# Patient Record
Sex: Female | Born: 1935 | Race: White | Hispanic: No | Smoking: Former smoker
Health system: Southern US, Community
[De-identification: ages and names within clinical notes are randomized; demographics above are authoritative.]

## PROBLEM LIST (undated history)

## (undated) DIAGNOSIS — F0390 Unspecified dementia without behavioral disturbance: Secondary | ICD-10-CM

## (undated) DIAGNOSIS — I10 Essential (primary) hypertension: Secondary | ICD-10-CM

## (undated) DIAGNOSIS — B338 Other specified viral diseases: Secondary | ICD-10-CM

## (undated) DIAGNOSIS — M1990 Unspecified osteoarthritis, unspecified site: Secondary | ICD-10-CM

## (undated) DIAGNOSIS — I639 Cerebral infarction, unspecified: Secondary | ICD-10-CM

## (undated) HISTORY — PX: FOOT SURGERY: SHX648

## (undated) HISTORY — PX: REPLACEMENT TOTAL KNEE: SUR1224

## (undated) HISTORY — PX: HIP SURGERY: SHX245

## (undated) HISTORY — PX: ABDOMINAL HYSTERECTOMY: SHX81

## (undated) HISTORY — DX: Other specified viral diseases: B33.8

---

## 2020-04-21 ENCOUNTER — Emergency Department (HOSPITAL_COMMUNITY)
Admission: EM | Admit: 2020-04-21 | Discharge: 2020-04-21 | Disposition: A | Discharge status: Home / Self Care | Payer: Medicare Other | Attending: Emergency Medicine | Admitting: Emergency Medicine

## 2020-04-21 ENCOUNTER — Emergency Department (HOSPITAL_COMMUNITY): Payer: Medicare Other

## 2020-04-21 ENCOUNTER — Encounter (HOSPITAL_COMMUNITY): Payer: Self-pay | Admitting: Emergency Medicine

## 2020-04-21 ENCOUNTER — Other Ambulatory Visit: Payer: Self-pay

## 2020-04-21 DIAGNOSIS — R441 Visual hallucinations: Secondary | ICD-10-CM | POA: Insufficient documentation

## 2020-04-21 DIAGNOSIS — I10 Essential (primary) hypertension: Secondary | ICD-10-CM | POA: Insufficient documentation

## 2020-04-21 DIAGNOSIS — F0390 Unspecified dementia without behavioral disturbance: Secondary | ICD-10-CM | POA: Insufficient documentation

## 2020-04-21 DIAGNOSIS — R519 Headache, unspecified: Secondary | ICD-10-CM | POA: Diagnosis not present

## 2020-04-21 DIAGNOSIS — Z79899 Other long term (current) drug therapy: Secondary | ICD-10-CM | POA: Insufficient documentation

## 2020-04-21 HISTORY — DX: Unspecified osteoarthritis, unspecified site: M19.90

## 2020-04-21 HISTORY — DX: Essential (primary) hypertension: I10

## 2020-04-21 HISTORY — DX: Cerebral infarction, unspecified: I63.9

## 2020-04-21 HISTORY — DX: Unspecified dementia, unspecified severity, without behavioral disturbance, psychotic disturbance, mood disturbance, and anxiety: F03.90

## 2020-04-21 LAB — COMPREHENSIVE METABOLIC PANEL
ALT: 9 U/L (ref 0–44)
AST: 26 U/L (ref 15–41)
Albumin: 4.2 g/dL (ref 3.5–5.0)
Alkaline Phosphatase: 95 U/L (ref 38–126)
Anion gap: 11 (ref 5–15)
BUN: 28 mg/dL — ABNORMAL HIGH (ref 8–23)
CO2: 26 mmol/L (ref 22–32)
Calcium: 9.6 mg/dL (ref 8.9–10.3)
Chloride: 102 mmol/L (ref 98–111)
Creatinine, Ser: 1.25 mg/dL — ABNORMAL HIGH (ref 0.44–1.00)
GFR, Estimated: 41 mL/min — ABNORMAL LOW (ref >60)
Glucose, Bld: 146 mg/dL — ABNORMAL HIGH (ref 70–99)
Potassium: 4.5 mmol/L (ref 3.5–5.1)
Sodium: 139 mmol/L (ref 135–145)
Total Bilirubin: 0.7 mg/dL (ref 0.3–1.2)
Total Protein: 8 g/dL (ref 6.5–8.1)

## 2020-04-21 LAB — CBC WITH DIFFERENTIAL/PLATELET
Abs Immature Granulocytes: 0.04 10*3/uL (ref 0.00–0.07)
Basophils Absolute: 0.1 10*3/uL (ref 0.0–0.1)
Basophils Relative: 0 %
Eosinophils Absolute: 0.2 10*3/uL (ref 0.0–0.5)
Eosinophils Relative: 1 %
HCT: 41.8 % (ref 36.0–46.0)
Hemoglobin: 12.9 g/dL (ref 12.0–15.0)
Immature Granulocytes: 0 %
Lymphocytes Relative: 17 %
Lymphs Abs: 2.2 10*3/uL (ref 0.7–4.0)
MCH: 29.9 pg (ref 26.0–34.0)
MCHC: 30.9 g/dL (ref 30.0–36.0)
MCV: 96.8 fL (ref 80.0–100.0)
Monocytes Absolute: 1.3 10*3/uL — ABNORMAL HIGH (ref 0.1–1.0)
Monocytes Relative: 10 %
Neutro Abs: 9.1 10*3/uL — ABNORMAL HIGH (ref 1.7–7.7)
Neutrophils Relative %: 72 %
Platelets: 226 10*3/uL (ref 150–400)
RBC: 4.32 MIL/uL (ref 3.87–5.11)
RDW: 17.1 % — ABNORMAL HIGH (ref 11.5–15.5)
WBC: 12.9 10*3/uL — ABNORMAL HIGH (ref 4.0–10.5)
nRBC: 0 % (ref 0.0–0.2)

## 2020-04-21 LAB — URINALYSIS, ROUTINE W REFLEX MICROSCOPIC
Bacteria, UA: NONE SEEN
Bilirubin Urine: NEGATIVE
Glucose, UA: NEGATIVE mg/dL
Hgb urine dipstick: NEGATIVE
Ketones, ur: NEGATIVE mg/dL
Nitrite: NEGATIVE
Protein, ur: 30 mg/dL — AB
Specific Gravity, Urine: 1.017 (ref 1.005–1.030)
pH: 7 (ref 5.0–8.0)

## 2020-04-21 LAB — LIPASE, BLOOD: Lipase: 32 U/L (ref 11–51)

## 2020-04-21 MED ORDER — CEPHALEXIN 500 MG PO CAPS: 500.0000 mg | ORAL_CAPSULE | Freq: Two times a day (BID) | ORAL | 0 refills | Status: AC

## 2020-04-21 MED ORDER — SODIUM CHLORIDE 0.9 % IV SOLN
1.0000 g | Freq: Once | INTRAVENOUS | Status: AC
  Administered 2020-04-21: 1 g via INTRAVENOUS
  Filled 2020-04-21: qty 10

## 2020-04-21 NOTE — ED Provider Notes (Signed)
[facility] EMERGENCY DEPARTMENT Provider Note   CSN: [id number] Arrival date & time: 04/21/20  1642     History No chief complaint on file.   Susan Osborne is a 87 y.o. female with a history of hypertension, CVA and dementia presenting for evaluation of hallucinations and daily headache which has been present for about the past 2 weeks.  She describes headache pain which seems to be worse at night, improves during the daytime.  Daughter at the bedside states that she has been having visual hallucinations, for example while sitting here this evening she commented on the faces that she sees in the wall in front of her.  She has had chronic nausea without emesis and has seen her PCP for this problem, currently taking Zofran to help her with the symptom.  Daughter states she fell last week additionally, patient recalls getting wedged between the toilet and the wall but denies hitting her head.  She denies difficulty walking, dizziness, no focal weakness.  Additionally she has had no fevers or chills, no chest pain, shortness of breath, denies ear pain, tinnitus, neck pain or stiffness.  She reports a fair appetite.  She lives alone but has spent the past several days at her daughter's home.  The history is provided by the patient and a relative.       Past Medical History:  Diagnosis Date  . Arthritis   . Dementia (HCC)   . Hypertension   . Stroke [facility])     There are no problems to display for this patient.   Past Surgical History:  Procedure Laterality Date  . ABDOMINAL HYSTERECTOMY    . FOOT SURGERY Right   . HIP SURGERY Right   . REPLACEMENT TOTAL KNEE Right      OB History   No obstetric history on file.     No family history on file.     Home Medications Prior to Admission medications   Medication Sig Start Date End Date Taking? Authorizing Provider  amLODipine (NORVASC) 5 MG tablet Take 5 mg by mouth daily. 07/02/19  Yes [provider]  atorvastatin  (LIPITOR) 40 MG tablet Take 40 mg by mouth at bedtime. 04/03/19  Yes [provider]  busPIRone (BUSPAR) 10 MG tablet Take 10 mg by mouth 2 (two) times daily. 04/08/20  Yes [provider]  Cholecalciferol 50 MCG (2000 UT) TABS Take 1 tablet by mouth daily. 10/28/19 10/27/20 Yes [provider]  dipyridamole-aspirin (AGGRENOX) 200-25 MG 12hr capsule Take 1 capsule by mouth 2 (two) times daily. 03/04/19  Yes [provider]  donepezil (ARICEPT) 10 MG tablet Take 10 mg by mouth at bedtime. 09/30/19  Yes [provider]  famotidine (PEPCID) 40 MG tablet Take 40 mg by mouth daily. 03/25/20  Yes [provider]  levothyroxine (SYNTHROID) 25 MCG tablet Take 25 mcg by mouth daily before breakfast. 03/30/20  Yes [provider]  ondansetron (ZOFRAN) 4 MG tablet Take 4 mg by mouth every 8 (eight) hours as needed for nausea or vomiting. 03/25/20  Yes [provider]  oxyCODONE (OXY IR/ROXICODONE) 5 MG immediate release tablet Take 5 mg by mouth every 6 (six) hours as needed for moderate pain.   Yes [provider]  venlafaxine XR (EFFEXOR-XR) 150 MG 24 hr capsule Take by mouth daily with breakfast. 04/08/20  Yes [provider]  amoxicillin (AMOXIL) 875 MG tablet amoxicillin 875 mg tablet Patient not taking: No sig reported    [provider]  celecoxib (CELEBREX) 200 MG capsule Celebrex 200 mg capsule Patient not taking: No sig reported    [provider]  citalopram (CELEXA) 10 MG tablet citalopram 10 mg tablet    [provider]  clotrimazole-betamethasone (LOTRISONE) cream clotrimazole-betamethasone 1 %-0.05 % topical cream Patient not taking: No sig reported    [provider]  Cyanocobalamin 5000 MCG SUBL Place under the tongue. Patient not taking: No sig reported 02/06/20   [provider]  DULoxetine (CYMBALTA) 30 MG capsule Take 30 mg by mouth at bedtime. Patient not  taking: No sig reported 12/12/19   [provider]  enoxaparin (LOVENOX) 30 MG/0.3ML injection SMARTSIG:0.3 Milliliter(s) SUB-Q Daily Patient not taking: No sig reported 10/29/19   [provider]  fluconazole (DIFLUCAN) 100 MG tablet fluconazole 100 mg tablet Patient not taking: No sig reported    [provider]  furosemide (LASIX) 20 MG tablet furosemide 20 mg tablet  TK 1 T PO QAM Patient not taking: No sig reported    [provider]  lidocaine (XYLOCAINE) 5 % ointment lidocaine 5 % topical ointment Patient not taking: No sig reported    [provider]  mupirocin ointment (BACTROBAN) 2 % mupirocin 2 % topical ointment Patient not taking: No sig reported    [provider]  omeprazole (PRILOSEC) 40 MG capsule omeprazole 40 mg capsule,delayed release Patient not taking: No sig reported 04/03/19   [provider]  potassium chloride (KLOR-CON) 10 MEQ tablet Take 10 mEq by mouth daily. Patient not taking: No sig reported 12/30/19   [provider]  triamcinolone cream (KENALOG) 0.1 % triamcinolone acetonide 0.1 % topical cream Patient not taking: No sig reported    [provider]    Allergies    Patient has no known allergies.  Review of Systems   Review of Systems  Constitutional: Negative for chills and fever.  HENT: Negative for congestion.   Eyes: Negative.   Respiratory: Negative for chest tightness and shortness of breath.   Cardiovascular: Negative for chest pain.  Gastrointestinal: Positive for nausea. Negative for abdominal pain.  Genitourinary: Negative.   Musculoskeletal: Negative for arthralgias, joint swelling and neck pain.  Skin: Negative.  Negative for rash and wound.  Neurological: Positive for headaches. Negative for dizziness, weakness, light-headedness and numbness.  Psychiatric/Behavioral: Positive for confusion and hallucinations.    Physical Exam Updated Vital Signs BP  128/76   Pulse 93   Temp 98.7 F (37.1 C) (Oral)   Resp (!) 22   SpO2 95%   Physical Exam Vitals and nursing note reviewed.  Constitutional:      Appearance: She is well-developed.  HENT:     Head: Normocephalic and atraumatic.  Eyes:     Conjunctiva/sclera: Conjunctivae normal.  Cardiovascular:     Rate and Rhythm: Normal rate and regular rhythm.     Heart sounds: Normal heart sounds.  Pulmonary:     Effort: Pulmonary effort is normal.     Breath sounds: Normal breath sounds. No wheezing.  Abdominal:     General: Bowel sounds are normal.     Palpations: Abdomen is soft.     Tenderness: There is no abdominal tenderness.  Musculoskeletal:        General: Normal range of motion.     Cervical back: Normal range of motion.  Skin:    General: Skin is warm and dry.  Neurological:     General: No focal deficit present.     Mental Status: She  is alert.     Cranial Nerves: Cranial nerves are intact. No cranial nerve deficit, dysarthria or facial asymmetry.     Sensory: No sensory deficit.     Motor: Motor function is intact. No weakness.     Coordination: Rapid alternating movements normal.     Comments: Equal grip strength. Oriented to person and time.   Psychiatric:        Mood and Affect: Mood normal.     ED Results / Procedures / Treatments   Labs (all labs ordered are listed, but only abnormal results are displayed) Labs Reviewed  CBC WITH DIFFERENTIAL/PLATELET - Abnormal; Notable for the following components:      Result Value   WBC 12.9 (*)    RDW 17.1 (*)    Neutro Abs 9.1 (*)    Monocytes Absolute 1.3 (*)    All other components within normal limits  URINALYSIS, ROUTINE W REFLEX MICROSCOPIC - Abnormal; Notable for the following components:   APPearance HAZY (*)    Protein, ur 30 (*)    Leukocytes,Ua TRACE (*)    All other components within normal limits  COMPREHENSIVE METABOLIC PANEL - Abnormal; Notable for the following components:   Glucose, Bld 146 (*)     BUN 28 (*)    Creatinine, Ser 1.25 (*)    GFR, Estimated 41 (*)    All other components within normal limits  URINE CULTURE  LIPASE, BLOOD    EKG None  Radiology CT Head Wo Contrast  Result Date: 04/21/2020 CLINICAL DATA:  Mental status change, unknown cause EXAM: CT HEAD WITHOUT CONTRAST TECHNIQUE: Contiguous axial images were obtained from the base of the skull through the vertex without intravenous contrast. COMPARISON:  None. FINDINGS: Brain: Brain volume is normal for age. No intracranial hemorrhage, mass effect, or midline shift. No hydrocephalus. The basilar cisterns are patent. Mild to moderate periventricular and deep chronic small vessel ischemia. No evidence of territorial infarct or acute ischemia. No extra-axial or intracranial fluid collection. Vascular: Atherosclerosis of skullbase vasculature without hyperdense vessel or abnormal calcification. Skull: No fracture or focal lesion. Sinuses/Orbits: Paranasal sinuses and mastoid air cells are clear. The visualized orbits are unremarkable. Bilateral cataract resection. Other: None. IMPRESSION: 1. No acute intracranial abnormality. 2. Normal for age atrophy. Mild to moderate chronic small vessel ischemia. Electronically Signed   By: Narda Rutherford M.D.   On: 04/21/2020 17:56   DG Chest Port 1 View  Result Date: 04/21/2020 CLINICAL DATA:  Altered mental status EXAM: PORTABLE CHEST 1 VIEW COMPARISON:  None. FINDINGS: Patient rotated right. Midline trachea. Mild cardiomegaly. Atherosclerosis in the transverse aorta. Apparent right paratracheal soft tissue fullness could be due to technique and prominent great vessels. Mild right hemidiaphragm elevation. Suspicion of a small hiatal hernia. No pleural effusion or pneumothorax. No lobar consolidation. No congestive failure. Mild pulmonary interstitial prominence is nonspecific, especially in this age group. IMPRESSION: No acute process or explanation for altered mental status. Apparent  right paratracheal soft tissue fullness could be due to prominent great vessels and AP portable technique. Consider follow-up with PA and lateral radiographs with attention to this area. Possible hiatal hernia. Aortic Atherosclerosis (ICD10-I70.0). Electronically Signed   By: Jeronimo Greaves M.D.   On: 04/21/2020 18:19    Procedures Procedures   Medications Ordered in ED Medications  cefTRIAXone (ROCEPHIN) 1 g in sodium chloride 0.9 % 100 mL IVPB (1 g Intravenous New Bag/Given 04/21/20 2157)    ED Course  I have reviewed the triage vital  signs and the nursing notes.  Pertinent labs & imaging results that were available during my care of the patient were reviewed by me and considered in my medical decision making (see chart for details).    MDM Rules/Calculators/A&P                          Patient with increasing confusion of unclear etiology, associated with hallucinations.  Patient is aware of these hallucinations and recognized them as not to be normal.  She is appropriate during this ED visit, alert and oriented x2, baseline mentation per daughter at bedside and this 87-year-old with known dementia.  She does have leukocytes in her urine, no symptoms suggesting UTI but will treat for this possibility.  She was given an IV dose of Rocephin here.  We will send her home with Keflex and close follow-up with her PCP.    Patient was also seen by Dr. Renaye Rakersrifan during this ED visit who discussed alternative treatment, specifically admission, but would probably ultimately be for placement in a memory care unit.  Patient and daughter at the bedside are not ready for this step and agreed to home trial with antibiotics in the event this is a UTI which is causing her confusion.  We will plan close follow-up with her PCP in a couple of days if this is not improving.  She will go home to daughters home.  Final Clinical Impression(s) / ED Diagnoses Final diagnoses:  Hallucinations, visual    Rx / DC  Orders ED Discharge Orders    None       Victoriano Laindol, Katesha Eichel, PA-C 04/22/20 1640    Terald Sleeperrifan, Matthew J, MD 05/02/20 [phone]

## 2020-04-21 NOTE — ED Triage Notes (Signed)
Emergency Medicine Provider Triage Evaluation Note  Susan Osborne , a 88 y.o. female  was evaluated in triage.  Pt complains of daily headache, worse at night now with hallucinations, seeing faces on the wall, denies head injury but fell last week.  Nausea and vomiting intermittently, no fever, cp, sob, abd pain.  No dysuria.   Review of Systems  Positive: Headaches, AMS, nv Negative: Fever, cp, sob, abd pain, focal weakness  Physical Exam  BP 104/79 (BP Location: Right Arm)   Pulse (!) 101   Temp 98.7 F (37.1 C) (Oral)   Resp 14   SpO2 98%  Gen:   Awake, no distress  HEENT:  Atraumatic Resp:  Normal effort  Cardiac:  Normal rate borderline tachy Abd:   Nondistended, nontender . MSK:   Moves extremities without difficulty , no neglect Neuro:  Speech clear   Medical Decision Making  Medically screening exam initiated at 5:16 PM.  Appropriate orders placed.  Gera Sarno was informed that the remainder of the evaluation will be completed by another provider, this initial triage assessment does not replace that evaluation, and the importance of remaining in the ED until their evaluation is complete.  Clinical Impression  AMS, headache, n/v.   Burgess Amor, PA-C 04/21/20 1719

## 2020-04-21 NOTE — Discharge Instructions (Signed)
Your exam and labs tonight are reassuring as is your head CT.  There is some haziness in your urine along with white blood cells which may suggest an early urinary tract infection.  This can cause confusion.  You are being treated for this potential infection with the medication prescribed take your first tablet dose tomorrow morning once you get the prescription.  It will be very important for you to have close follow-up with your primary doctor, call for a recheck appointment within the next several days, especially if you have worsening symptoms of confusion or hallucinations, or you develop new symptoms such as fever, vomiting or abdominal pain.

## 2020-04-21 NOTE — ED Notes (Signed)
Pt attempted to provide urine sample and accidentally missed nuns cap. Will try to obtain urine sample again.

## 2020-04-21 NOTE — ED Triage Notes (Addendum)
having hallucinations, constipated that has continued to get worse over the past week.  Headache reported today. Nausea that has been going on for weeks, pt currently taking zofran for the nausea.  Family member states she fell last week but didn't hit her head.

## 2020-04-23 LAB — URINE CULTURE: Culture: <10000 — AB

## 2020-06-18 ENCOUNTER — Other Ambulatory Visit: Payer: Self-pay

## 2020-06-18 ENCOUNTER — Emergency Department (HOSPITAL_COMMUNITY): Payer: Medicare Other

## 2020-06-18 ENCOUNTER — Encounter (HOSPITAL_COMMUNITY): Payer: Self-pay | Admitting: *Deleted

## 2020-06-18 ENCOUNTER — Emergency Department (HOSPITAL_COMMUNITY)
Admission: EM | Admit: 2020-06-18 | Discharge: 2020-06-18 | Disposition: A | Discharge status: Home / Self Care | Payer: Medicare Other | Attending: Emergency Medicine | Admitting: Emergency Medicine

## 2020-06-18 DIAGNOSIS — Z79899 Other long term (current) drug therapy: Secondary | ICD-10-CM | POA: Diagnosis not present

## 2020-06-18 DIAGNOSIS — R112 Nausea with vomiting, unspecified: Secondary | ICD-10-CM

## 2020-06-18 DIAGNOSIS — Z7901 Long term (current) use of anticoagulants: Secondary | ICD-10-CM | POA: Diagnosis not present

## 2020-06-18 DIAGNOSIS — E860 Dehydration: Secondary | ICD-10-CM | POA: Diagnosis not present

## 2020-06-18 DIAGNOSIS — F0390 Unspecified dementia without behavioral disturbance: Secondary | ICD-10-CM | POA: Diagnosis not present

## 2020-06-18 DIAGNOSIS — R519 Headache, unspecified: Secondary | ICD-10-CM | POA: Diagnosis not present

## 2020-06-18 DIAGNOSIS — Z7982 Long term (current) use of aspirin: Secondary | ICD-10-CM | POA: Diagnosis not present

## 2020-06-18 DIAGNOSIS — I10 Essential (primary) hypertension: Secondary | ICD-10-CM | POA: Insufficient documentation

## 2020-06-18 LAB — CBC WITH DIFFERENTIAL/PLATELET
Abs Immature Granulocytes: 0.05 10*3/uL (ref 0.00–0.07)
Basophils Absolute: 0 10*3/uL (ref 0.0–0.1)
Basophils Relative: 0 %
Eosinophils Absolute: 0.1 10*3/uL (ref 0.0–0.5)
Eosinophils Relative: 1 %
HCT: 41.2 % (ref 36.0–46.0)
Hemoglobin: 13.2 g/dL (ref 12.0–15.0)
Immature Granulocytes: 0 %
Lymphocytes Relative: 14 %
Lymphs Abs: 1.8 10*3/uL (ref 0.7–4.0)
MCH: 31.1 pg (ref 26.0–34.0)
MCHC: 32 g/dL (ref 30.0–36.0)
MCV: 96.9 fL (ref 80.0–100.0)
Monocytes Absolute: 1.2 10*3/uL — ABNORMAL HIGH (ref 0.1–1.0)
Monocytes Relative: 9 %
Neutro Abs: 9.1 10*3/uL — ABNORMAL HIGH (ref 1.7–7.7)
Neutrophils Relative %: 76 %
Platelets: 224 10*3/uL (ref 150–400)
RBC: 4.25 MIL/uL (ref 3.87–5.11)
RDW: 16.2 % — ABNORMAL HIGH (ref 11.5–15.5)
WBC: 12.2 10*3/uL — ABNORMAL HIGH (ref 4.0–10.5)
nRBC: 0 % (ref 0.0–0.2)

## 2020-06-18 LAB — COMPREHENSIVE METABOLIC PANEL
ALT: 10 U/L (ref 0–44)
AST: 14 U/L — ABNORMAL LOW (ref 15–41)
Albumin: 4 g/dL (ref 3.5–5.0)
Alkaline Phosphatase: 90 U/L (ref 38–126)
Anion gap: 8 (ref 5–15)
BUN: 16 mg/dL (ref 8–23)
CO2: 24 mmol/L (ref 22–32)
Calcium: 9.4 mg/dL (ref 8.9–10.3)
Chloride: 105 mmol/L (ref 98–111)
Creatinine, Ser: 0.88 mg/dL (ref 0.44–1.00)
GFR, Estimated: >60 mL/min (ref >60)
Glucose, Bld: 152 mg/dL — ABNORMAL HIGH (ref 70–99)
Potassium: 3.5 mmol/L (ref 3.5–5.1)
Sodium: 137 mmol/L (ref 135–145)
Total Bilirubin: 0.6 mg/dL (ref 0.3–1.2)
Total Protein: 7.5 g/dL (ref 6.5–8.1)

## 2020-06-18 LAB — URINALYSIS, ROUTINE W REFLEX MICROSCOPIC
Bilirubin Urine: NEGATIVE
Glucose, UA: NEGATIVE mg/dL
Ketones, ur: NEGATIVE mg/dL
Leukocytes,Ua: NEGATIVE
Nitrite: NEGATIVE
Protein, ur: 100 mg/dL — AB
Specific Gravity, Urine: 1.018 (ref 1.005–1.030)
pH: 5 (ref 5.0–8.0)

## 2020-06-18 MED ORDER — SODIUM CHLORIDE 0.9 % IV SOLN: INTRAVENOUS | Status: DC

## 2020-06-18 MED ORDER — SCOPOLAMINE 1 MG/3DAYS TD PT72: 1.0000 | MEDICATED_PATCH | TRANSDERMAL | 12 refills | Status: DC

## 2020-06-18 MED ORDER — ONDANSETRON HCL 4 MG/2ML IJ SOLN
4.0000 mg | INTRAMUSCULAR | Status: AC
  Administered 2020-06-18: 4 mg via INTRAVENOUS
  Filled 2020-06-18: qty 2

## 2020-06-18 NOTE — ED Triage Notes (Signed)
Intermittent vomiting for several months

## 2020-06-18 NOTE — Discharge Instructions (Addendum)
Your symptoms showed no Urinary tract infection (the culture has been sent to confirm and will result in the next 48 hours).  If there is an infection we will call in an antibiotic for you.  If it is not infected, you will not receive a phone call. Your blood work was normal Your CT scan of the head was normal The Chest Xray was normal  We have given you some IV fluids  As an alternative to taking Zofran, I would recommend trying the scopolamine patch which I have prescribed.  You placed this behind the ear, you may switch it every 72 hours with a new patch but make sure you take the old one off.  Also be aware that this may make you sleepy, please do not drive a vehicle and make sure that someone is watching in the first 24 hours to make sure you do not have any side effects.  Please follow-up with your family doctor within 48 hours and have them obtain your results so that you can discuss the neck steps which may include an endoscopy by gastroenterologist.

## 2020-06-18 NOTE — ED Provider Notes (Signed)
[facility] Provider Note   CSN: [id number] Arrival date & time: 06/18/20  1454     History Chief Complaint  Patient presents with   Emesis    [facility] is a 87 y.o. female.  This patient has dementia - level 5 caveat applies Daughter is historian - has had 2-3 months of vomiting - persistent but worsening - taking zofran daily but no help Vomits at all times - not just when eating - no associated diarrhea, fever or coughing Seen by PCP - CT ordered at [facility]  ED - neg per family Hasn't had endoscopy yet - has had some weight loss Prior hx of BRCA s/p resection many years ago On chronic opiates for her chronic leg pain - for years has been on vicodin. No new sx - PCP told her to bring pt to ED to day as they weren't able to see her.   Emesis     Past Medical History:  Diagnosis Date   Arthritis    Dementia ([place])    Hypertension    Stroke ([place])     There are no problems to display for this patient.   Past Surgical History:  Procedure Laterality Date   ABDOMINAL HYSTERECTOMY     FOOT SURGERY Right    HIP SURGERY Right    REPLACEMENT TOTAL KNEE Right      OB History   No obstetric history on file.     No family history on file.     Home Medications Prior to Admission medications   Medication Sig Start Date End Date Taking? Authorizing Provider  scopolamine (TRANSDERM-SCOP, 1.5 MG,) 1 MG/3DAYS Place 1 patch (1.5 mg total) onto the skin every 3 (three) days. 06/18/20  Yes Noemi Chapel, MD  amLODipine (NORVASC) 5 MG tablet Take 5 mg by mouth daily. 07/02/19   [provider]  amoxicillin (AMOXIL) 875 MG tablet amoxicillin 875 mg tablet Patient not taking: No sig reported    [provider]  atorvastatin (LIPITOR) 40 MG tablet Take 40 mg by mouth at bedtime. 04/03/19   [provider]  busPIRone (BUSPAR) 10 MG tablet Take 10 mg by mouth 2 (two) times daily. 04/08/20   [provider]  celecoxib  (CELEBREX) 200 MG capsule Celebrex 200 mg capsule Patient not taking: No sig reported    [provider]  Cholecalciferol 50 MCG (2000 UT) TABS Take 1 tablet by mouth daily. 10/28/19 10/27/20  [provider]  citalopram (CELEXA) 10 MG tablet citalopram 10 mg tablet    [provider]  clotrimazole-betamethasone (LOTRISONE) cream clotrimazole-betamethasone 1 %-0.05 % topical cream Patient not taking: No sig reported    [provider]  Cyanocobalamin 5000 [place] under the tongue. Patient not taking: No sig reported 02/06/20   [provider]  dipyridamole-aspirin (AGGRENOX) 200-25 MG 12hr capsule Take 1 capsule by mouth 2 (two) times daily. 03/04/19   [provider]  donepezil (ARICEPT) 10 MG tablet Take 10 mg by mouth at bedtime. 09/30/19   [provider]  DULoxetine (CYMBALTA) 30 MG capsule Take 30 mg by mouth at bedtime. Patient not taking: No sig reported 12/12/19   [provider]  enoxaparin (LOVENOX) 30 MG/0.3ML injection SMARTSIG:0.3 Milliliter(s) SUB-Q Daily Patient not taking: No sig reported 10/29/19   [provider]  famotidine (PEPCID) 40 MG tablet Take 40 mg by mouth daily. 03/25/20   [provider]  fluconazole (DIFLUCAN) 100 MG tablet fluconazole 100 mg tablet  Patient not taking: No sig reported    [provider]  furosemide (LASIX) 20 MG tablet furosemide 20 mg tablet  TK 1 T PO QAM Patient not taking: No sig reported    [provider]  levothyroxine (SYNTHROID) 25 MCG tablet Take 25 mcg by mouth daily before breakfast. 03/30/20   [provider]  lidocaine (XYLOCAINE) 5 % ointment lidocaine 5 % topical ointment Patient not taking: No sig reported    [provider]  mupirocin ointment (BACTROBAN) 2 % mupirocin 2 % topical ointment Patient not taking: No sig reported    [provider]  omeprazole (PRILOSEC) 40 MG capsule omeprazole  40 mg capsule,delayed release Patient not taking: No sig reported 04/03/19   [provider]  ondansetron (ZOFRAN) 4 MG tablet Take 4 mg by mouth every 8 (eight) hours as needed for nausea or vomiting. 03/25/20   [provider]  oxyCODONE (OXY IR/ROXICODONE) 5 MG immediate release tablet Take 5 mg by mouth every 6 (six) hours as needed for moderate pain.    [provider]  potassium chloride (KLOR-CON) 10 MEQ tablet Take 10 mEq by mouth daily. Patient not taking: No sig reported 12/30/19   [provider]  triamcinolone cream (KENALOG) 0.1 % triamcinolone acetonide 0.1 % topical cream Patient not taking: No sig reported    [provider]  venlafaxine XR (EFFEXOR-XR) 150 MG 24 hr capsule Take by mouth daily with breakfast. 04/08/20   [provider]    Allergies    Patient has no known allergies.  Review of Systems   Review of Systems  Unable to perform ROS: Dementia  Gastrointestinal:  Positive for vomiting.   Physical Exam Updated Vital Signs BP (!) 150/73   Pulse 82   Temp 98.4 F (36.9 C)   Resp (!) 23   Ht 1.588 m (5' 2.5")   Wt 79.8 kg   SpO2 96%   BMI 31.68 kg/m   Physical Exam Vitals and nursing note reviewed.  Constitutional:      General: She is not in acute distress.    Appearance: She is well-developed.  HENT:     Head: Normocephalic and atraumatic.     Mouth/Throat:     Pharynx: No oropharyngeal exudate.  Eyes:     General: No scleral icterus.       Right eye: No discharge.        Left eye: No discharge.     Conjunctiva/sclera: Conjunctivae normal.     Pupils: Pupils are equal, round, and reactive to light.  Neck:     Thyroid: No thyromegaly.     Vascular: No JVD.  Cardiovascular:     Rate and Rhythm: Normal rate and regular rhythm.     Heart sounds: Normal heart sounds. No murmur heard.   No friction rub. No gallop.  Pulmonary:     Effort: Pulmonary effort is normal. No respiratory distress.      Breath sounds: Normal breath sounds. No wheezing or rales.  Abdominal:     General: Bowel sounds are normal. There is no distension.     Palpations: Abdomen is soft. There is no mass.     Tenderness: There is no abdominal tenderness.     Comments: No ttp over the abdomen  Musculoskeletal:        General: No tenderness. Normal range of motion.     Cervical back: Normal range of motion and neck supple.     Right lower leg:  No edema.     Left lower leg: No edema.  Lymphadenopathy:     Cervical: No cervical adenopathy.  Skin:    General: Skin is warm and dry.     Findings: No erythema or rash.  Neurological:     General: No focal deficit present.     Mental Status: She is alert. Mental status is at baseline.     Coordination: Coordination normal.  Psychiatric:        Behavior: Behavior normal.    ED Results / Procedures / Treatments   Labs (all labs ordered are listed, but only abnormal results are displayed) Labs Reviewed  CBC WITH DIFFERENTIAL/PLATELET - Abnormal; Notable for the following components:      Result Value   WBC 12.2 (*)    RDW 16.2 (*)    Neutro Abs 9.1 (*)    Monocytes Absolute 1.2 (*)    All other components within normal limits  COMPREHENSIVE METABOLIC PANEL - Abnormal; Notable for the following components:   Glucose, Bld 152 (*)    AST 14 (*)    All other components within normal limits  URINALYSIS, ROUTINE W REFLEX MICROSCOPIC - Abnormal; Notable for the following components:   APPearance HAZY (*)    Hgb urine dipstick SMALL (*)    Protein, ur 100 (*)    Bacteria, UA RARE (*)    All other components within normal limits  URINE CULTURE    EKG EKG Interpretation  Date/Time:  Thursday June 18 2020 15:15:34 EDT Ventricular Rate:  89 PR Interval:  151 QRS Duration: 100 QT Interval:  374 QTC Calculation: 456 R Axis:   -25 Text Interpretation: Sinus rhythm Probable left ventricular hypertrophy Anterior Q waves, possibly due to LVH No old tracing to  compare Confirmed by Noemi Chapel [phone]) on 06/18/2020 3:55:34 PM  Radiology DG Chest 2 View  Result Date: 06/18/2020 CLINICAL DATA:  Vomiting, weight loss. EXAM: CHEST - 2 VIEW COMPARISON:  April 21, 2020. FINDINGS: The heart size and mediastinal contours are within normal limits. Both lungs are clear. The visualized skeletal structures are unremarkable. IMPRESSION: No active cardiopulmonary disease. Aortic Atherosclerosis (ICD10-I70.0). Electronically Signed   By: Marijo Conception M.D.   On: 06/18/2020 16:25   CT Head Wo Contrast  Result Date: 06/18/2020 CLINICAL DATA:  Headache and vomiting. EXAM: CT HEAD WITHOUT CONTRAST TECHNIQUE: Contiguous axial images were obtained from the base of the skull through the vertex without intravenous contrast. COMPARISON:  April 21, 2020 FINDINGS: Brain: There is mild cerebral atrophy with widening of the extra-axial spaces and ventricular dilatation. There are areas of decreased attenuation within the white matter tracts of the supratentorial brain, consistent with microvascular disease changes. Vascular: No hyperdense vessel or unexpected calcification. Skull: Normal. Negative for fracture or focal lesion. Sinuses/Orbits: No acute finding. Other: None. IMPRESSION: 1. Generalized cerebral atrophy. 2. No acute intracranial abnormality. Electronically Signed   By: Virgina Norfolk M.D.   On: 06/18/2020 16:28    Procedures Procedures   Medications Ordered in ED Medications  0.9 %  sodium chloride infusion ( Intravenous New Bag/Given 06/18/20 1541)  ondansetron (ZOFRAN) injection 4 mg (4 mg Intravenous Given 06/18/20 1545)    ED Course  I have reviewed the triage vital signs and the nursing notes.  Pertinent labs & imaging results that were available during my care of the patient were reviewed by me and considered in my medical decision making (see chart for details).    MDM Rules/Calculators/A&P  No distress, persistent vomiting - VS  reassuring - needs labs, CT head due to the headaches and vomiting, CXR, labs and EKG, IVf, pt and family agreeable.    Labs reassuring - no obvious UTI, labs reassuring, fluids given, no signs of sepsis, CT head negative  Try scop patch at home - daughter given precautions Urine Culture sent  Final Clinical Impression(s) / ED Diagnoses Final diagnoses:  Non-intractable vomiting with nausea, unspecified vomiting type  Dehydration    Rx / DC Orders ED Discharge Orders          Ordered    scopolamine (TRANSDERM-SCOP, 1.5 MG,) 1 MG/3DAYS  every 72 hours        06/18/20 1732             Noemi Chapel, MD 06/18/20 1736

## 2020-06-20 LAB — URINE CULTURE: Culture: NO GROWTH

## 2020-07-21 ENCOUNTER — Other Ambulatory Visit: Payer: Self-pay

## 2020-07-21 ENCOUNTER — Emergency Department (HOSPITAL_COMMUNITY): Payer: Medicare Other

## 2020-07-21 ENCOUNTER — Encounter (HOSPITAL_COMMUNITY): Payer: Self-pay | Admitting: Emergency Medicine

## 2020-07-21 ENCOUNTER — Inpatient Hospital Stay (HOSPITAL_COMMUNITY)
Admission: EM | Admit: 2020-07-21 | Discharge: 2020-07-24 | DRG: 884 | Disposition: A | Discharge status: Home / Self Care | Payer: Medicare Other | Attending: Internal Medicine | Admitting: Internal Medicine

## 2020-07-21 DIAGNOSIS — I10 Essential (primary) hypertension: Secondary | ICD-10-CM | POA: Diagnosis present

## 2020-07-21 DIAGNOSIS — E041 Nontoxic single thyroid nodule: Secondary | ICD-10-CM

## 2020-07-21 DIAGNOSIS — E785 Hyperlipidemia, unspecified: Secondary | ICD-10-CM | POA: Diagnosis not present

## 2020-07-21 DIAGNOSIS — Z8673 Personal history of transient ischemic attack (TIA), and cerebral infarction without residual deficits: Secondary | ICD-10-CM | POA: Diagnosis not present

## 2020-07-21 DIAGNOSIS — R4182 Altered mental status, unspecified: Secondary | ICD-10-CM | POA: Diagnosis present

## 2020-07-21 DIAGNOSIS — Z79890 Hormone replacement therapy (postmenopausal): Secondary | ICD-10-CM

## 2020-07-21 DIAGNOSIS — G9341 Metabolic encephalopathy: Secondary | ICD-10-CM | POA: Diagnosis not present

## 2020-07-21 DIAGNOSIS — Z87891 Personal history of nicotine dependence: Secondary | ICD-10-CM | POA: Diagnosis not present

## 2020-07-21 DIAGNOSIS — E039 Hypothyroidism, unspecified: Secondary | ICD-10-CM | POA: Diagnosis present

## 2020-07-21 DIAGNOSIS — Z79899 Other long term (current) drug therapy: Secondary | ICD-10-CM | POA: Diagnosis not present

## 2020-07-21 DIAGNOSIS — Z96651 Presence of right artificial knee joint: Secondary | ICD-10-CM | POA: Diagnosis not present

## 2020-07-21 DIAGNOSIS — F0390 Unspecified dementia without behavioral disturbance: Principal | ICD-10-CM | POA: Diagnosis present

## 2020-07-21 DIAGNOSIS — D72829 Elevated white blood cell count, unspecified: Secondary | ICD-10-CM | POA: Diagnosis present

## 2020-07-21 DIAGNOSIS — I159 Secondary hypertension, unspecified: Secondary | ICD-10-CM | POA: Diagnosis not present

## 2020-07-21 DIAGNOSIS — M1990 Unspecified osteoarthritis, unspecified site: Secondary | ICD-10-CM | POA: Diagnosis not present

## 2020-07-21 DIAGNOSIS — Z885 Allergy status to narcotic agent status: Secondary | ICD-10-CM

## 2020-07-21 DIAGNOSIS — E669 Obesity, unspecified: Secondary | ICD-10-CM | POA: Diagnosis present

## 2020-07-21 DIAGNOSIS — Z20822 Contact with and (suspected) exposure to covid-19: Secondary | ICD-10-CM | POA: Diagnosis not present

## 2020-07-21 DIAGNOSIS — Z6831 Body mass index (BMI) 31.0-31.9, adult: Secondary | ICD-10-CM

## 2020-07-21 LAB — CBC WITH DIFFERENTIAL/PLATELET
Abs Immature Granulocytes: 0.12 10*3/uL — ABNORMAL HIGH (ref 0.00–0.07)
Basophils Absolute: 0.1 10*3/uL (ref 0.0–0.1)
Basophils Relative: 1 %
Eosinophils Absolute: 0.2 10*3/uL (ref 0.0–0.5)
Eosinophils Relative: 1 %
HCT: 41.3 % (ref 36.0–46.0)
Hemoglobin: 13.1 g/dL (ref 12.0–15.0)
Immature Granulocytes: 1 %
Lymphocytes Relative: 25 %
Lymphs Abs: 3.6 10*3/uL (ref 0.7–4.0)
MCH: 30.8 pg (ref 26.0–34.0)
MCHC: 31.7 g/dL (ref 30.0–36.0)
MCV: 97.2 fL (ref 80.0–100.0)
Monocytes Absolute: 1.6 10*3/uL — ABNORMAL HIGH (ref 0.1–1.0)
Monocytes Relative: 11 %
Neutro Abs: 8.9 10*3/uL — ABNORMAL HIGH (ref 1.7–7.7)
Neutrophils Relative %: 61 %
Platelets: 241 10*3/uL (ref 150–400)
RBC: 4.25 MIL/uL (ref 3.87–5.11)
RDW: 16.2 % — ABNORMAL HIGH (ref 11.5–15.5)
WBC: 14.5 10*3/uL — ABNORMAL HIGH (ref 4.0–10.5)
nRBC: 0 % (ref 0.0–0.2)

## 2020-07-21 LAB — COMPREHENSIVE METABOLIC PANEL
ALT: 13 U/L (ref 0–44)
AST: 16 U/L (ref 15–41)
Albumin: 4.1 g/dL (ref 3.5–5.0)
Alkaline Phosphatase: 119 U/L (ref 38–126)
Anion gap: 8 (ref 5–15)
BUN: 23 mg/dL (ref 8–23)
CO2: 25 mmol/L (ref 22–32)
Calcium: 9.2 mg/dL (ref 8.9–10.3)
Chloride: 103 mmol/L (ref 98–111)
Creatinine, Ser: 0.95 mg/dL (ref 0.44–1.00)
GFR, Estimated: 57 mL/min — ABNORMAL LOW (ref >60)
Glucose, Bld: 151 mg/dL — ABNORMAL HIGH (ref 70–99)
Potassium: 3.4 mmol/L — ABNORMAL LOW (ref 3.5–5.1)
Sodium: 136 mmol/L (ref 135–145)
Total Bilirubin: 0.4 mg/dL (ref 0.3–1.2)
Total Protein: 7.7 g/dL (ref 6.5–8.1)

## 2020-07-21 LAB — URINALYSIS, ROUTINE W REFLEX MICROSCOPIC
Bacteria, UA: NONE SEEN
Bilirubin Urine: NEGATIVE
Glucose, UA: NEGATIVE mg/dL
Ketones, ur: NEGATIVE mg/dL
Leukocytes,Ua: NEGATIVE
Nitrite: NEGATIVE
Protein, ur: NEGATIVE mg/dL
Specific Gravity, Urine: 1.004 — ABNORMAL LOW (ref 1.005–1.030)
pH: 5 (ref 5.0–8.0)

## 2020-07-21 LAB — TROPONIN I (HIGH SENSITIVITY)
Troponin I (High Sensitivity): 14 ng/L (ref <18)
Troponin I (High Sensitivity): 15 ng/L (ref <18)

## 2020-07-21 LAB — RESP PANEL BY RT-PCR (FLU A&B, COVID) ARPGX2
Influenza A by PCR: NEGATIVE
Influenza B by PCR: NEGATIVE
SARS Coronavirus 2 by RT PCR: NEGATIVE

## 2020-07-21 LAB — MAGNESIUM: Magnesium: 1.8 mg/dL (ref 1.7–2.4)

## 2020-07-21 LAB — LIPASE, BLOOD: Lipase: 45 U/L (ref 11–51)

## 2020-07-21 MED ORDER — ACETAMINOPHEN 325 MG PO TABS
650.0000 mg | ORAL_TABLET | Freq: Once | ORAL | Status: AC
  Administered 2020-07-21: 650 mg via ORAL
  Filled 2020-07-21: qty 2

## 2020-07-21 MED ORDER — SODIUM CHLORIDE 0.9 % IV BOLUS
500.0000 mL | Freq: Once | INTRAVENOUS | Status: AC
Start: 2020-07-21 — End: 2020-07-21
  Administered 2020-07-21: 500 mL via INTRAVENOUS

## 2020-07-21 MED ORDER — LEVOTHYROXINE SODIUM 25 MCG PO TABS
25.0000 ug | ORAL_TABLET | Freq: Every day | ORAL | Status: DC
  Administered 2020-07-22 – 2020-07-24 (×3): 25 ug via ORAL
  Filled 2020-07-21 (×3): qty 1

## 2020-07-21 MED ORDER — FAMOTIDINE 20 MG PO TABS
40.0000 mg | ORAL_TABLET | Freq: Every day | ORAL | Status: DC
  Administered 2020-07-22 – 2020-07-24 (×3): 40 mg via ORAL
  Filled 2020-07-21 (×3): qty 2

## 2020-07-21 MED ORDER — BUSPIRONE HCL 10 MG PO TABS
10.0000 mg | ORAL_TABLET | Freq: Two times a day (BID) | ORAL | Status: DC
  Administered 2020-07-22 – 2020-07-24 (×5): 10 mg via ORAL
  Filled 2020-07-21 (×5): qty 1

## 2020-07-21 MED ORDER — VENLAFAXINE HCL ER 75 MG PO CP24
150.0000 mg | ORAL_CAPSULE | Freq: Every day | ORAL | Status: DC
  Administered 2020-07-22 – 2020-07-24 (×3): 150 mg via ORAL
  Filled 2020-07-21 (×3): qty 2

## 2020-07-21 MED ORDER — ENOXAPARIN SODIUM 40 MG/0.4ML IJ SOSY
40.0000 mg | PREFILLED_SYRINGE | Freq: Every day | INTRAMUSCULAR | Status: DC
  Administered 2020-07-22 (×2): 40 mg via SUBCUTANEOUS
  Filled 2020-07-21 (×2): qty 0.4

## 2020-07-21 MED ORDER — IOHEXOL 350 MG/ML SOLN
100.0000 mL | Freq: Once | INTRAVENOUS | Status: AC | PRN
  Administered 2020-07-21: 100 mL via INTRAVENOUS

## 2020-07-21 MED ORDER — ACETAMINOPHEN 650 MG RE SUPP: 650.0000 mg | Freq: Four times a day (QID) | RECTAL | Status: DC | PRN

## 2020-07-21 MED ORDER — ATORVASTATIN CALCIUM 40 MG PO TABS
40.0000 mg | ORAL_TABLET | Freq: Every day | ORAL | Status: DC
  Administered 2020-07-22 – 2020-07-23 (×3): 40 mg via ORAL
  Filled 2020-07-21 (×3): qty 1

## 2020-07-21 MED ORDER — ONDANSETRON HCL 4 MG/2ML IJ SOLN
4.0000 mg | Freq: Once | INTRAMUSCULAR | Status: AC
  Administered 2020-07-21: 4 mg via INTRAVENOUS
  Filled 2020-07-21: qty 2

## 2020-07-21 MED ORDER — ONDANSETRON HCL 4 MG/2ML IJ SOLN: 4.0000 mg | Freq: Four times a day (QID) | INTRAMUSCULAR | Status: DC | PRN

## 2020-07-21 MED ORDER — DONEPEZIL HCL 10 MG PO TABS
10.0000 mg | ORAL_TABLET | Freq: Every day | ORAL | Status: DC
  Administered 2020-07-22 – 2020-07-23 (×3): 10 mg via ORAL
  Filled 2020-07-21 (×3): qty 1

## 2020-07-21 MED ORDER — ONDANSETRON HCL 4 MG PO TABS: 4.0000 mg | ORAL_TABLET | Freq: Four times a day (QID) | ORAL | Status: DC | PRN

## 2020-07-21 MED ORDER — HYDROCODONE-ACETAMINOPHEN 5-325 MG PO TABS: 1.0000 | ORAL_TABLET | Freq: Four times a day (QID) | ORAL | Status: DC | PRN

## 2020-07-21 MED ORDER — AMLODIPINE BESYLATE 5 MG PO TABS
5.0000 mg | ORAL_TABLET | Freq: Every day | ORAL | Status: DC
  Administered 2020-07-22 – 2020-07-24 (×3): 5 mg via ORAL
  Filled 2020-07-21 (×3): qty 1

## 2020-07-21 MED ORDER — ACETAMINOPHEN 325 MG PO TABS
650.0000 mg | ORAL_TABLET | Freq: Four times a day (QID) | ORAL | Status: DC | PRN
  Administered 2020-07-22: 650 mg via ORAL
  Filled 2020-07-21: qty 2

## 2020-07-21 NOTE — ED Notes (Signed)
Ambulated to bathroom with steady gait, continues to report pain right lower thigh with ambulation

## 2020-07-21 NOTE — ED Notes (Signed)
Attempted to call report x 1  

## 2020-07-21 NOTE — ED Provider Notes (Signed)
[facility] EMERGENCY DEPARTMENT Provider Note   CSN: [id number] Arrival date & time: 07/21/20  1231     History Chief Complaint  Patient presents with   Altered Mental Status    Susan Osborne is a 87 y.o. female.  HPI 87 year old female presents with concern for acute stroke/TIA.  History is from the patient and the daughter at the bedside and over the phone.  For the past few weeks and maybe up to a month she has had progressive intermittent symptoms that are concerning for stroke.  She will seem to be confused and have trouble speaking.  Her eyes will become dilated.  At max lasts about 10 minutes.  Today she had an episode where she stopped walking and the patient tells me that her whole body felt numb and she could not move.  Each time afterwards she gets a headache.  Today she is also having some vomiting.  She has a little bit of upper abdominal discomfort as well.  Right now the patient is at her mental status baseline, she does have some mild dementia.  There is report in the chart of possible facial droop but the daughter has not seen that.  Reportedly went to an outside hospital where she was told she might be having TIAs and they wanted to admit her but did not have room in the hospital so she was discharged.  Past Medical History:  Diagnosis Date   Arthritis    Dementia ([facility])    Hypertension    Stroke ([facility])     There are no problems to display for this patient.   Past Surgical History:  Procedure Laterality Date   ABDOMINAL HYSTERECTOMY     FOOT SURGERY Right    HIP SURGERY Right    REPLACEMENT TOTAL KNEE Right      OB History   No obstetric history on file.     History reviewed. No pertinent family history.     Home Medications Prior to Admission medications   Medication Sig Start Date End Date Taking? Authorizing Provider  amLODipine (NORVASC) 5 MG tablet Take 5 mg by mouth daily. 07/02/19   [provider]  amoxicillin (AMOXIL) 875 MG tablet  amoxicillin 875 mg tablet Patient not taking: No sig reported    [provider]  atorvastatin (LIPITOR) 40 MG tablet Take 40 mg by mouth at bedtime. 04/03/19   [provider]  busPIRone (BUSPAR) 10 MG tablet Take 10 mg by mouth 2 (two) times daily. 04/08/20   [provider]  celecoxib (CELEBREX) 200 MG capsule Celebrex 200 mg capsule Patient not taking: No sig reported    [provider]  Cholecalciferol 50 MCG (2000 UT) TABS Take 1 tablet by mouth daily. 10/28/19 10/27/20  [provider]  citalopram (CELEXA) 10 MG tablet citalopram 10 mg tablet    [provider]  clotrimazole-betamethasone (LOTRISONE) cream clotrimazole-betamethasone 1 %-0.05 % topical cream Patient not taking: No sig reported    [provider]  Cyanocobalamin 5000 MCG SUBL Place under the tongue. Patient not taking: No sig reported 02/06/20   [provider]  dipyridamole-aspirin (AGGRENOX) 200-25 MG 12hr capsule Take 1 capsule by mouth 2 (two) times daily. 03/04/19   [provider]  donepezil (ARICEPT) 10 MG tablet Take 10 mg by mouth at bedtime. 09/30/19   [provider]  DULoxetine (CYMBALTA) 30 MG capsule Take 30 mg by mouth at bedtime. Patient not taking: No sig reported 12/12/19  [provider]  enoxaparin (LOVENOX) 30 MG/0.3ML injection SMARTSIG:0.3 Milliliter(s) SUB-Q Daily Patient not taking: No sig reported 10/29/19   [provider]  famotidine (PEPCID) 40 MG tablet Take 40 mg by mouth daily. 03/25/20   [provider]  fluconazole (DIFLUCAN) 100 MG tablet fluconazole 100 mg tablet Patient not taking: No sig reported    [provider]  furosemide (LASIX) 20 MG tablet furosemide 20 mg tablet  TK 1 T PO QAM Patient not taking: No sig reported    [provider]  levothyroxine (SYNTHROID) 25 MCG tablet Take 25 mcg by mouth daily before breakfast. 03/30/20   [provider]  lidocaine (XYLOCAINE) 5 % ointment lidocaine 5 % topical ointment Patient not taking: No sig reported    [provider]  mupirocin ointment (BACTROBAN) 2 % mupirocin 2 % topical ointment Patient not taking: No sig reported    [provider]  omeprazole (PRILOSEC) 40 MG capsule omeprazole 40 mg capsule,delayed release Patient not taking: No sig reported 04/03/19   [provider]  ondansetron (ZOFRAN) 4 MG tablet Take 4 mg by mouth every 8 (eight) hours as needed for nausea or vomiting. 03/25/20   [provider]  oxyCODONE (OXY IR/ROXICODONE) 5 MG immediate release tablet Take 5 mg by mouth every 6 (six) hours as needed for moderate pain.    [provider]  potassium chloride (KLOR-CON) 10 MEQ tablet Take 10 mEq by mouth daily. Patient not taking: No sig reported 12/30/19   [provider]  scopolamine (TRANSDERM-SCOP, 1.5 MG,) 1 MG/3DAYS Place 1 patch (1.5 mg total) onto the skin every 3 (three) days. 06/18/20   Eber Hong, MD  triamcinolone cream (KENALOG) 0.1 % triamcinolone acetonide 0.1 % topical cream Patient not taking: No sig reported    [provider]  venlafaxine XR (EFFEXOR-XR) 150 MG 24 hr capsule Take by mouth daily with breakfast. 04/08/20   [provider]    Allergies    Codeine  Review of Systems   Review of Systems  Unable to perform ROS: Dementia   Physical Exam Updated Vital Signs BP (!) 141/126 (BP Location: Right Arm)   Pulse (!) 103   Temp 98.8 F (37.1 C) (Oral)   Resp 18   Ht 5\' 3"  (1.6 m)   Wt 81.6 kg   SpO2 99%   BMI 31.89 kg/m   Physical Exam Vitals and nursing note reviewed.  Constitutional:      General: She is not in acute distress.    Appearance: She is well-developed. She is not ill-appearing or diaphoretic.  HENT:     Head: Normocephalic and atraumatic.     Right Ear: External ear normal.     Left Ear: External ear normal.     Nose: Nose normal.  Eyes:      General:        Right eye: No discharge.        Left eye: No discharge.  Cardiovascular:     Rate and Rhythm: Normal rate and regular rhythm.     Heart sounds: Normal heart sounds.  Pulmonary:     Effort: Pulmonary effort is normal.     Breath sounds: Normal breath sounds.  Abdominal:     Palpations: Abdomen is soft.     Tenderness: There is abdominal tenderness in the epigastric area.  Musculoskeletal:     Comments: When lifting her right leg, she reports that her right knee hurts and she thinks she  banged it.  Upon palpation there is no obvious tenderness and she has a well-healed scar without obvious effusion or erythema.  Skin:    General: Skin is warm and dry.  Neurological:     Mental Status: She is alert. She is disoriented.     Comments: CN 3-12 grossly intact. 5/5 strength in all 4 extremities. Grossly normal sensation. Normal finger to nose.   Psychiatric:        Mood and Affect: Mood is not anxious.    ED Results / Procedures / Treatments   Labs (all labs ordered are listed, but only abnormal results are displayed) Labs Reviewed  COMPREHENSIVE METABOLIC PANEL  LIPASE, BLOOD  CBC WITH DIFFERENTIAL/PLATELET  URINALYSIS, ROUTINE W REFLEX MICROSCOPIC  MAGNESIUM  TROPONIN I (HIGH SENSITIVITY)    EKG None  Radiology No results found.  Procedures Procedures   Medications Ordered in ED Medications  sodium chloride 0.9 % bolus 500 mL (has no administration in time range)  ondansetron (ZOFRAN) injection 4 mg (has no administration in time range)  acetaminophen (TYLENOL) tablet 650 mg (has no administration in time range)    ED Course  I have reviewed the triage vital signs and the nursing notes.  Pertinent labs & imaging results that were available during my care of the patient were reviewed by me and considered in my medical decision making (see chart for details).    MDM Rules/Calculators/A&P                          Patient's exam shows no focal neuro  deficits. Does have some upper abdominal discomfort. Will get CT head and abd/pelvis. Will xray knee since she thinks she hit it yesterday. As for these waxing and waning symptoms, I discussed with neuro on call, Dr. Wilford Corner.  He recommends admission to [facility] with neurology consultation for MRI, EEG for possible TIAs versus seizures.  Recommend CTA head and neck.  When these are performed she will need to be admitted to [facility] which the daughter is aware of.  Care to Dr. Rhunette Croft. Final Clinical Impression(s) / ED Diagnoses Final diagnoses:  None    Rx / DC Orders ED Discharge Orders     None        Pricilla Loveless, MD 07/21/20 1538

## 2020-07-21 NOTE — Plan of Care (Addendum)
Received call from ED provider regarding this patient who has been brought from home for Complains of increasing confusion and episodic word finding difficulty and right facial weakness.  Has been evaluated in [place]-records unavailable for possible TIAs. Given the description of stereotypical episodes and advanced age, differentials to include seizures versus left hemispheric hypoperfusion. Recommended EEG, CTA head and neck and MRI of the brain.  Also check B12, TSH, RPR. Patient can be transferred to [facility] for a formal neurological consultation due to unavailable routine neurological consultation at [facility] this week. Discussed my plan with the calling provider Dr. Criss Alvine.  Will notify my team at [facility].  Please page neurology when the patient arrives at [facility].   -- Milon Dikes, MD Neurologist [facility] Pager: [phone]

## 2020-07-21 NOTE — H&P (Signed)
History and Physical    [name] URK:[id number] DOB: 1927-06-28 DOA: 07/21/2020  PCP: Theodoro Kos, MD   Patient coming from: Home  I have personally briefly reviewed patient's old medical records in [facility]  Chief Complaint: AMS  HPI: Susan Osborne is a 87 y.o. female with medical history significant for hypertension, stroke, dementia.  Patient was brought to the ED reports that over the past few weeks/1 month patient has had intermittent symptoms of confusion and difficulty speaking.  Today patient had an episode where she stopped walking, she could not move and her whole body felt numb, patient's pupils would get dilated.  She reports a headache after these episodes, and vomiting today after this occurred.  Patient's daughter is at bedside and collaborates some of the history.  Daughter reports patient has had about 3 episodes of this this week, and she has witnessed some of them.  Episodes last about 5 minutes, and on recovery, patient speech is initially slowed.  No prior history of seizures.  Symptoms occur spontaneously, known provoking or alleviating factors, and patient does not know when  its about to happen.  No facial asymmetry was noted by patient or daughter.  Patient reports she went to an outside facility, was told she may be having TIA, she was offered hospitalization, but subsequently discharged as they had no beds available.   ED Course: Temperature 98.8.  Heart rate 92-103, respiratory rate 17-21, blood pressure 126/52.  WBC 14.5.  Knee x-rays done for knee pain unremarkable.  Abdominal CT done for initial abdominal pain also without acute abnormality.  CTA head and neck without large vessel occlusion. EDP talked to neurologist Dr. Jerrell Belfast, who recommended admission to [facility], as no neurology service available at [facility].   Review of Systems: As per HPI all other systems reviewed and negative.  Past Medical History:  Diagnosis Date   Arthritis     Dementia (HCC)    Hypertension    Stroke [facility])     Past Surgical History:  Procedure Laterality Date   ABDOMINAL HYSTERECTOMY     FOOT SURGERY Right    HIP SURGERY Right    REPLACEMENT TOTAL KNEE Right      has no history on file for tobacco use, alcohol use, and drug use.  Allergies  Allergen Reactions   Codeine    Family hx of obesity.  Prior to Admission medications   Medication Sig Start Date End Date Taking? Authorizing Provider  amLODipine (NORVASC) 5 MG tablet Take 5 mg by mouth daily. 07/02/19   [provider]  amoxicillin (AMOXIL) 875 MG tablet amoxicillin 875 mg tablet Patient not taking: No sig reported    [provider]  atorvastatin (LIPITOR) 40 MG tablet Take 40 mg by mouth at bedtime. 04/03/19   [provider]  busPIRone (BUSPAR) 10 MG tablet Take 10 mg by mouth 2 (two) times daily. 04/08/20   [provider]  celecoxib (CELEBREX) 200 MG capsule Celebrex 200 mg capsule Patient not taking: No sig reported    [provider]  Cholecalciferol 50 MCG (2000 UT) TABS Take 1 tablet by mouth daily. 10/28/19 10/27/20  [provider]  citalopram (CELEXA) 10 MG tablet citalopram 10 mg tablet    [provider]  clotrimazole-betamethasone (LOTRISONE) cream clotrimazole-betamethasone 1 %-0.05 % topical cream Patient not taking: No sig reported    [provider]  Cyanocobalamin 5000 MCG SUBL Place under the tongue. Patient not taking: No sig reported  02/06/20   [provider]  dipyridamole-aspirin (AGGRENOX) 200-25 MG 12hr capsule Take 1 capsule by mouth 2 (two) times daily. 03/04/19   [provider]  donepezil (ARICEPT) 10 MG tablet Take 10 mg by mouth at bedtime. 09/30/19   [provider]  DULoxetine (CYMBALTA) 30 MG capsule Take 30 mg by mouth at bedtime. Patient not taking: No sig reported 12/12/19   [provider]  enoxaparin (LOVENOX) 30 MG/0.3ML injection  SMARTSIG:0.3 Milliliter(s) SUB-Q Daily Patient not taking: No sig reported 10/29/19   [provider]  famotidine (PEPCID) 40 MG tablet Take 40 mg by mouth daily. 03/25/20   [provider]  fluconazole (DIFLUCAN) 100 MG tablet fluconazole 100 mg tablet Patient not taking: No sig reported    [provider]  furosemide (LASIX) 20 MG tablet furosemide 20 mg tablet  TK 1 T PO QAM Patient not taking: No sig reported    [provider]  levothyroxine (SYNTHROID) 25 MCG tablet Take 25 mcg by mouth daily before breakfast. 03/30/20   [provider]  lidocaine (XYLOCAINE) 5 % ointment lidocaine 5 % topical ointment Patient not taking: No sig reported    [provider]  mupirocin ointment (BACTROBAN) 2 % mupirocin 2 % topical ointment Patient not taking: No sig reported    [provider]  omeprazole (PRILOSEC) 40 MG capsule omeprazole 40 mg capsule,delayed release Patient not taking: No sig reported 04/03/19   [provider]  ondansetron (ZOFRAN) 4 MG tablet Take 4 mg by mouth every 8 (eight) hours as needed for nausea or vomiting. 03/25/20   [provider]  oxyCODONE (OXY IR/ROXICODONE) 5 MG immediate release tablet Take 5 mg by mouth every 6 (six) hours as needed for moderate pain.    [provider]  potassium chloride (KLOR-CON) 10 MEQ tablet Take 10 mEq by mouth daily. Patient not taking: No sig reported 12/30/19   [provider]  scopolamine (TRANSDERM-SCOP, 1.5 MG,) 1 MG/3DAYS Place 1 patch (1.5 mg total) onto the skin every 3 (three) days. 06/18/20   Eber HongMiller, Brian, MD  triamcinolone cream (KENALOG) 0.1 % triamcinolone acetonide 0.1 % topical cream Patient not taking: No sig reported    [provider]  venlafaxine XR (EFFEXOR-XR) 150 MG 24 hr capsule Take by mouth daily with breakfast. 04/08/20   [provider]    Physical Exam: Vitals:   07/21/20 1430 07/21/20 1500 07/21/20  1530 07/21/20 1600  BP: (!) 144/79 126/65 (!) 152/84 (!) 148/62  Pulse: 92 94 92 92  Resp: 20 17 (!) 21 20  Temp:      TempSrc:      SpO2: 96% 99% 97% 91%  Weight:      Height:        Constitutional: NAD, calm, comfortable Vitals:   07/21/20 1430 07/21/20 1500 07/21/20 1530 07/21/20 1600  BP: (!) 144/79 126/65 (!) 152/84 (!) 148/62  Pulse: 92 94 92 92  Resp: 20 17 (!) 21 20  Temp:      TempSrc:      SpO2: 96% 99% 97% 91%  Weight:      Height:       Eyes: PERRL, lids and conjunctivae normal ENMT: Mucous membranes are moist.   Neck: normal, supple, no masses, no thyromegaly Respiratory: clear to auscultation bilaterally, no wheezing, no crackles. Normal respiratory effort. No accessory muscle use.  Cardiovascular: Regular rate and rhythm, no murmurs / rubs / gallops. No extremity edema. 2+ pedal pulses.  Abdomen: no tenderness, no masses palpated. No hepatosplenomegaly. Bowel sounds positive.  Musculoskeletal: no clubbing / cyanosis. No joint deformity upper and lower extremities. Good ROM, no contractures. Normal muscle tone.  Skin: no rashes, lesions, ulcers. No induration Neurologic:  Neurological:     Mental Status: She is alert.     GCS: GCS eye subscore is 4. GCS verbal subscore is 5. GCS motor subscore is 6.     Comments: Mental Status:  Alert, oriented, thought content appropriate, able to give a coherent history. Speech fluent without evidence of aphasia. Able to follow 2 step commands without difficulty.  Cranial Nerves:  II:  Peripheral visual fields grossly normal, pupils equal, round, reactive to light III,IV, VI: ptosis not present, extra-ocular motions intact bilaterally  V,VII: smile symmetric, eyebrows raise symmetric, facial light touch sensation equal VIII: hearing grossly normal to voice  X:  XI: bilateral shoulder shrug symmetric and strong XII: midline tongue extension without fassiculations Motor:  Normal tone.   Equal full strength in all  extremities , 5/5 strength in lower extremities bilaterally including strong and equal dorsiflexion/plantar flexion. Sensory: Sensation intact to light touch in all extremities.  Cerebellar:   CV: distal pulses palpable throughout   Psychiatric: Normal judgment and insight. Alert and oriented x 3. Normal mood.   Labs on Admission: I have personally reviewed following labs and imaging studies  CBC: Recent Labs  Lab 07/21/20 1338  WBC 14.5*  NEUTROABS 8.9*  HGB 13.1  HCT 41.3  MCV 97.2  PLT 241   Basic Metabolic Panel: Recent Labs  Lab 07/21/20 1338  NA 136  K 3.4*  CL 103  CO2 25  GLUCOSE 151*  BUN 23  CREATININE 0.95  CALCIUM 9.2  MG 1.8   Liver Function Tests: Recent Labs  Lab 07/21/20 1338  AST 16  ALT 13  ALKPHOS 119  BILITOT 0.4  PROT 7.7  ALBUMIN 4.1   Recent Labs  Lab 07/21/20 1338  LIPASE 45   Urine analysis:    Component Value Date/Time   COLORURINE STRAW (A) 07/21/2020 1339   APPEARANCEUR CLEAR 07/21/2020 1339   LABSPEC 1.004 (L) 07/21/2020 1339   PHURINE 5.0 07/21/2020 1339   GLUCOSEU NEGATIVE 07/21/2020 1339   HGBUR SMALL (A) 07/21/2020 1339   BILIRUBINUR NEGATIVE 07/21/2020 1339   KETONESUR NEGATIVE 07/21/2020 1339   PROTEINUR NEGATIVE 07/21/2020 1339   NITRITE NEGATIVE 07/21/2020 1339   LEUKOCYTESUR NEGATIVE 07/21/2020 1339    Radiological Exams on Admission: CT Angio Head W or Wo Contrast  Result Date: 07/21/2020 CLINICAL DATA:  Neuro deficit, acute stroke suspected. EXAM: CT HEAD WITHOUT CONTRAST CT ANGIOGRAPHY OF THE HEAD AND NECK TECHNIQUE: Contiguous axial images were obtained from the base of the skull through the vertex without intravenous contrast. Multidetector CT imaging of the head and neck was performed using the standard protocol during bolus administration of intravenous contrast. Multiplanar CT image reconstructions and MIPs were obtained to evaluate the vascular anatomy. Carotid stenosis measurements (when applicable)  are obtained utilizing NASCET criteria, using the distal internal carotid diameter as the denominator. CONTRAST:  OMNIPAQUE IOHEXOL 350 MG/ML SOLN COMPARISON:  CT head June 18, 2020. FINDINGS: CT HEAD Brain: No evidence of acute large vascular territory infarction, hemorrhage, hydrocephalus, extra-axial collection or mass lesion/mass effect. Patchy white matter hypoattenuation, most likely related to chronic microvascular ischemic disease. Vascular: See below. Skull: No acute fracture. Sinuses/Orbits: Visualized sinuses are clear. No acute orbital findings. Other: No mastoid effusions. CTA NECK Aortic arch: Calcific  atherosclerosis of the aorta and its branch vessels. Great vessel origins are patent. Right carotid system: No significant (greater than 50%) stenosis. Mild atherosclerosis at the carotid bifurcation. Tortuous ICA. Left carotid system: No significant (greater than 50%) stenosis. Mild atherosclerosis at the carotid bifurcation. Tortuous ICA. Vertebral arteries:Co dominant. Approximately 40% narrowing of the left vertebral artery origin which is due to mass effect from the adjacent vertebral body (the left vertebral artery originates from the posterior aspect of the left subclavian artery and courses anteromedially between the subclavian artery and the vertebral body). Mildly dolichoectasia attic vertebrobasilar system. Other neck: Heterogeneous thyroid with large (4.2 cm) right thyroid nodule. Additional smaller thyroid nodules. Visualized lung apices are clear. CTA HEAD Anterior circulation: Bilateral intracranial ICAs, MCAs, and ACAs are patent without proximal flow limiting stenosis. Mild for age bilateral intracranial ICA calcific atherosclerosis. No aneurysm identified. Posterior circulation: Bilateral intradural vertebral arteries, basilar artery, and bilateral posterior cerebral arteries are patent without proximal flow limiting stenosis. Venous sinuses: Visualized dural venous sinuses appear  patent. IMPRESSION: CT Head: No evidence of acute intracranial abnormality. CTA Head: 1. No large vessel occlusion or proximal flow limiting stenosis. 2. Mildly dolichoectasia of the vertebrobasilar system. CTA Neck: 1. Approximately 40% narrowing of the left vertebral artery origin which is due to mass effect from the adjacent vertebral body (the left vertebral artery originates from the posterior aspect of the left subclavian artery and courses anteromedially between the subclavian artery and the vertebral body). 2. Otherwise, no significant stenosis in the neck. 3. Heterogeneous thyroid with large (4.2 cm) right thyroid nodule. Recommend thyroid US (ref: J Am Coll Radiol. 2015 Feb;12(2): 143-50). Electronically Signed   By: Feliberto Harts MD   On: 07/21/2020 16:24   CT Angio Neck W and/or Wo Contrast  Result Date: 07/21/2020 CLINICAL DATA:  Neuro deficit, acute stroke suspected. EXAM: CT HEAD WITHOUT CONTRAST CT ANGIOGRAPHY OF THE HEAD AND NECK TECHNIQUE: Contiguous axial images were obtained from the base of the skull through the vertex without intravenous contrast. Multidetector CT imaging of the head and neck was performed using the standard protocol during bolus administration of intravenous contrast. Multiplanar CT image reconstructions and MIPs were obtained to evaluate the vascular anatomy. Carotid stenosis measurements (when applicable) are obtained utilizing NASCET criteria, using the distal internal carotid diameter as the denominator. CONTRAST:  OMNIPAQUE IOHEXOL 350 MG/ML SOLN COMPARISON:  CT head June 18, 2020. FINDINGS: CT HEAD Brain: No evidence of acute large vascular territory infarction, hemorrhage, hydrocephalus, extra-axial collection or mass lesion/mass effect. Patchy white matter hypoattenuation, most likely related to chronic microvascular ischemic disease. Vascular: See below. Skull: No acute fracture. Sinuses/Orbits: Visualized sinuses are clear. No acute orbital findings.  Other: No mastoid effusions. CTA NECK Aortic arch: Calcific atherosclerosis of the aorta and its branch vessels. Great vessel origins are patent. Right carotid system: No significant (greater than 50%) stenosis. Mild atherosclerosis at the carotid bifurcation. Tortuous ICA. Left carotid system: No significant (greater than 50%) stenosis. Mild atherosclerosis at the carotid bifurcation. Tortuous ICA. Vertebral arteries:Co dominant. Approximately 40% narrowing of the left vertebral artery origin which is due to mass effect from the adjacent vertebral body (the left vertebral artery originates from the posterior aspect of the left subclavian artery and courses anteromedially between the subclavian artery and the vertebral body). Mildly dolichoectasia attic vertebrobasilar system. Other neck: Heterogeneous thyroid with large (4.2 cm) right thyroid nodule. Additional smaller thyroid nodules. Visualized lung apices are clear. CTA HEAD Anterior circulation: Bilateral intracranial ICAs, MCAs,  and ACAs are patent without proximal flow limiting stenosis. Mild for age bilateral intracranial ICA calcific atherosclerosis. No aneurysm identified. Posterior circulation: Bilateral intradural vertebral arteries, basilar artery, and bilateral posterior cerebral arteries are patent without proximal flow limiting stenosis. Venous sinuses: Visualized dural venous sinuses appear patent. IMPRESSION: CT Head: No evidence of acute intracranial abnormality. CTA Head: 1. No large vessel occlusion or proximal flow limiting stenosis. 2. Mildly dolichoectasia of the vertebrobasilar system. CTA Neck: 1. Approximately 40% narrowing of the left vertebral artery origin which is due to mass effect from the adjacent vertebral body (the left vertebral artery originates from the posterior aspect of the left subclavian artery and courses anteromedially between the subclavian artery and the vertebral body). 2. Otherwise, no significant stenosis in the  neck. 3. Heterogeneous thyroid with large (4.2 cm) right thyroid nodule. Recommend thyroid US (ref: J Am Coll Radiol. 2015 Feb;12(2): 143-50). Electronically Signed   By: Feliberto Harts MD   On: 07/21/2020 16:24   CT ABDOMEN PELVIS W CONTRAST  Result Date: 07/21/2020 CLINICAL DATA:  Nausea and vomiting EXAM: CT ABDOMEN AND PELVIS WITH CONTRAST TECHNIQUE: Multidetector CT imaging of the abdomen and pelvis was performed using the standard protocol following bolus administration of intravenous contrast. CONTRAST:  OMNIPAQUE IOHEXOL 350 MG/ML SOLN COMPARISON:  None. FINDINGS: Lower chest: Included lung bases are clear. Heart size is within normal limits. Coronary artery calcification. Hepatobiliary: No focal liver abnormality is seen. No gallstones, gallbladder wall thickening, or biliary dilatation. Pancreas: Unremarkable. No pancreatic ductal dilatation or surrounding inflammatory changes. Spleen: Normal in size without focal abnormality. Adrenals/Urinary Tract: Unremarkable adrenal glands. Bilateral renal cortical atrophy with multiple small rounded low-attenuation lesions, likely cysts although some are too small to definitively characterize. No renal stone or hydronephrosis. Urinary bladder within normal limits. Stomach/Bowel: Stomach is within normal limits. Moderate-sized duodenal diverticulum measuring approximately 4.5 cm. The appendix is not visualized and may be surgically absent. Extensive colonic diverticulosis. No evidence of bowel wall thickening, distention, or inflammatory changes. Vascular/Lymphatic: Extensive aortoiliac atherosclerosis without aneurysm. No abdominopelvic lymphadenopathy. Reproductive: Anteverted uterus. Mildly prominent endometrial stripe. No adnexal masses. Other: No free fluid. No abdominopelvic fluid collection. No pneumoperitoneum. Small supraumbilical abdominal wall hernia with a small portion of transverse colon protruding into the hernia sac. Musculoskeletal: Prior  right hip ORIF. Multilevel degenerative changes of the lumbar spine. No acute osseous findings. No suspicious bony lesion. IMPRESSION: 1. No acute abdominopelvic findings. 2. Extensive colonic diverticulosis without evidence of acute diverticulitis. 3. Small supraumbilical abdominal wall hernia with a small portion of transverse colon protruding into the hernia sac. Negative for bowel obstruction. 4. Mildly prominent endometrial stripe. Further evaluation with pelvic ultrasound can be considered on a clinical basis given patient's age and comorbidities. 5. Aortic atherosclerosis (ICD10-I70.0). Electronically Signed   By: Duanne Guess D.O.   On: 07/21/2020 16:44   DG Knee Complete 4 Views Right  Result Date: 07/21/2020 CLINICAL DATA:  Knee injury EXAM: RIGHT KNEE - COMPLETE 4+ VIEW COMPARISON:  None. FINDINGS: Prior right knee replacement. No hardware complicating feature. No acute bony abnormality. Specifically, no fracture, subluxation, or dislocation. No joint effusion. IMPRESSION: Prior right knee replacement.  No acute bony abnormality. Electronically Signed   By: Charlett Nose M.D.   On: 07/21/2020 14:12    EKG: Independently reviewed.  Sinus rhythm rate 91, QTc 478.  Assessment/Plan Principal Problem:   AMS (altered mental status) Active Problems:   HTN (hypertension)   AMS- (encephalopathy type unspecified at this time ).  Intermittent episodes of confusion, episodic word finding difficulty with subsequent headaches.  No auras or prodrome.  No seizure history.  CTA head and neck without large vessel occlusion, and 40% narrowing of left vertebral artery ( see detailed report). - EDP consulted Dr. Jerrell Belfast, differentials include seizures versus left hemispheric hypoperfusion.  Recommended checking TSH, RPR, vitamin B12. -MRI brain -EEG - Care order instruction to page neurology on arrival to [facility]  HTN- Stable -Resume Norvasc  Dementia -Resume Effexor, buspirone, and  donezepil  Leukocytosis 14.5 appears to be a chronic issue no focus of infection identified at this time - trend  DVT prophylaxis: Lovenox Code Status: Full code Family Communication: Daughter at bedside Disposition Plan: ~ 1 -2 days Consults called: Neurology Admission status: Step down, Tele I certify that at the point of admission it is my clinical judgment that the patient will require inpatient hospital care spanning beyond 2 midnights from the point of admission due to high intensity of service, high risk for further deterioration and high frequency of surveillance required.    Onnie Boer MD [facility]  07/21/2020, 11:32 PM

## 2020-07-21 NOTE — ED Notes (Signed)
Daughter at bedside.

## 2020-07-21 NOTE — ED Triage Notes (Addendum)
Pt c/o confusion and unable to speak earlier this morning. Pt states this has resolved. Pt states she is concerned that she may have had a few strokes in the past few months, states she was seen at [facility] for this but they didn't find anything.

## 2020-07-21 NOTE — ED Notes (Signed)
Patient to CT at this time

## 2020-07-21 NOTE — ED Notes (Signed)
Patient transported to X-ray 

## 2020-07-22 ENCOUNTER — Observation Stay (HOSPITAL_COMMUNITY): Payer: Medicare Other

## 2020-07-22 ENCOUNTER — Encounter (HOSPITAL_COMMUNITY): Payer: Self-pay | Admitting: Internal Medicine

## 2020-07-22 ENCOUNTER — Inpatient Hospital Stay (HOSPITAL_COMMUNITY): Payer: Medicare Other

## 2020-07-22 DIAGNOSIS — R258 Other abnormal involuntary movements: Secondary | ICD-10-CM

## 2020-07-22 DIAGNOSIS — D72829 Elevated white blood cell count, unspecified: Secondary | ICD-10-CM | POA: Diagnosis present

## 2020-07-22 DIAGNOSIS — Z79890 Hormone replacement therapy (postmenopausal): Secondary | ICD-10-CM | POA: Diagnosis not present

## 2020-07-22 DIAGNOSIS — Z6831 Body mass index (BMI) 31.0-31.9, adult: Secondary | ICD-10-CM | POA: Diagnosis not present

## 2020-07-22 DIAGNOSIS — R4789 Other speech disturbances: Secondary | ICD-10-CM | POA: Diagnosis not present

## 2020-07-22 DIAGNOSIS — M1990 Unspecified osteoarthritis, unspecified site: Secondary | ICD-10-CM | POA: Diagnosis present

## 2020-07-22 DIAGNOSIS — G9341 Metabolic encephalopathy: Secondary | ICD-10-CM | POA: Diagnosis present

## 2020-07-22 DIAGNOSIS — R404 Transient alteration of awareness: Secondary | ICD-10-CM | POA: Diagnosis not present

## 2020-07-22 DIAGNOSIS — R4182 Altered mental status, unspecified: Secondary | ICD-10-CM | POA: Diagnosis present

## 2020-07-22 DIAGNOSIS — I10 Essential (primary) hypertension: Secondary | ICD-10-CM | POA: Diagnosis present

## 2020-07-22 DIAGNOSIS — Z20822 Contact with and (suspected) exposure to covid-19: Secondary | ICD-10-CM | POA: Diagnosis present

## 2020-07-22 DIAGNOSIS — E669 Obesity, unspecified: Secondary | ICD-10-CM | POA: Diagnosis present

## 2020-07-22 DIAGNOSIS — Z96651 Presence of right artificial knee joint: Secondary | ICD-10-CM | POA: Diagnosis present

## 2020-07-22 DIAGNOSIS — Z79899 Other long term (current) drug therapy: Secondary | ICD-10-CM | POA: Diagnosis not present

## 2020-07-22 DIAGNOSIS — E785 Hyperlipidemia, unspecified: Secondary | ICD-10-CM | POA: Diagnosis present

## 2020-07-22 DIAGNOSIS — Z87891 Personal history of nicotine dependence: Secondary | ICD-10-CM | POA: Diagnosis not present

## 2020-07-22 DIAGNOSIS — E041 Nontoxic single thyroid nodule: Secondary | ICD-10-CM | POA: Diagnosis not present

## 2020-07-22 DIAGNOSIS — Z8673 Personal history of transient ischemic attack (TIA), and cerebral infarction without residual deficits: Secondary | ICD-10-CM | POA: Diagnosis not present

## 2020-07-22 DIAGNOSIS — F0390 Unspecified dementia without behavioral disturbance: Secondary | ICD-10-CM | POA: Diagnosis present

## 2020-07-22 DIAGNOSIS — E039 Hypothyroidism, unspecified: Secondary | ICD-10-CM | POA: Diagnosis present

## 2020-07-22 DIAGNOSIS — I159 Secondary hypertension, unspecified: Secondary | ICD-10-CM | POA: Diagnosis not present

## 2020-07-22 DIAGNOSIS — Z885 Allergy status to narcotic agent status: Secondary | ICD-10-CM | POA: Diagnosis not present

## 2020-07-22 LAB — CBC
HCT: 37.2 % (ref 36.0–46.0)
Hemoglobin: 11.9 g/dL — ABNORMAL LOW (ref 12.0–15.0)
MCH: 30.4 pg (ref 26.0–34.0)
MCHC: 32 g/dL (ref 30.0–36.0)
MCV: 95.1 fL (ref 80.0–100.0)
Platelets: 208 10*3/uL (ref 150–400)
RBC: 3.91 MIL/uL (ref 3.87–5.11)
RDW: 16.6 % — ABNORMAL HIGH (ref 11.5–15.5)
WBC: 15.5 10*3/uL — ABNORMAL HIGH (ref 4.0–10.5)
nRBC: 0 % (ref 0.0–0.2)

## 2020-07-22 LAB — RPR: RPR Ser Ql: NONREACTIVE

## 2020-07-22 LAB — PROCALCITONIN: Procalcitonin: <0.1 ng/mL

## 2020-07-22 LAB — BASIC METABOLIC PANEL
Anion gap: 8 (ref 5–15)
BUN: 18 mg/dL (ref 8–23)
CO2: 28 mmol/L (ref 22–32)
Calcium: 9.2 mg/dL (ref 8.9–10.3)
Chloride: 102 mmol/L (ref 98–111)
Creatinine, Ser: 0.99 mg/dL (ref 0.44–1.00)
GFR, Estimated: 54 mL/min — ABNORMAL LOW (ref >60)
Glucose, Bld: 120 mg/dL — ABNORMAL HIGH (ref 70–99)
Potassium: 3.6 mmol/L (ref 3.5–5.1)
Sodium: 138 mmol/L (ref 135–145)

## 2020-07-22 LAB — TSH: TSH: 0.407 u[IU]/mL (ref 0.350–4.500)

## 2020-07-22 LAB — VITAMIN B12: Vitamin B-12: 392 pg/mL (ref 180–914)

## 2020-07-22 NOTE — Progress Notes (Signed)
PROGRESS NOTE  Susan Osborne GMW:[id number] DOB: [birth year] DOA: 07/21/2020 PCP: Theodoro Kos, MD  HPI/Recap of past 74 hours: 87 year old female past medical history of obesity, dementia and hypertension and previous CVA presented to the emergency room on 7/12 with reports of intermittent episodes of difficulty speaking as well as confusion over the past month.  Patient also had an episode on day of arrival to the ED of a headache after a spell where she stopped walking and could not move and felt her whole body getting.  In the emergency room, CT angiogram of head and neck without large vessel occlusion.  White blood cell count noted at 14.5.  Admitted to the hospitalist service.  Neurology consulted.  This morning, patient doing okay.  A little cold, but no other complaints.  Assessment/Plan: Principal Problem:   AMS (altered mental status)/acute metabolic encephalopathy with chronic underlying dementia: MRI brain unremarkable.  EEG notes no evidence of seizures.  Checking B12 plus TSH and RPR.  Continue Aricept. Active Problems:   HTN (hypertension): Blood pressure stable.    Obesity (BMI 30-39.9): Meets criteria BMI greater than 30.    Hyperlipidemia  Leukocytosis: Unclear etiology.  Stress margination?  Urine clear, no respiratory complaints and lung bases on abdominal CT unremarkable.  Check procalcitonin level  Right thyroid nodule: Noted on CT angiogram of neck.  Getting thyroid ultrasound.  Code Status: Full code  Family Communication: Updated daughter by phone.  Disposition Plan: Potential discharge tomorrow   Consultants: Neurology  Procedures: EEG done on 7/13: No evidence of acute seizures noted.  Generalized slow waves consistent with encephalopathy/dementia  Antimicrobials: None  DVT prophylaxis: Lovenox  Level of care: Telemetry Medical   Objective: Vitals:   07/22/20 0557 07/22/20 1236  BP: 123/72 119/84  Pulse: 84 79  Resp: 17 18  Temp: 97.7  F (36.5 C) 98.2 F (36.8 C)  SpO2: 100% 97%    Intake/Output Summary (Last 24 hours) at 07/22/2020 1443 Last data filed at 07/22/2020 1030 Gross per 24 hour  Intake 240 ml  Output --  Net 240 ml   Filed Weights   07/21/20 1239  Weight: 81.6 kg   Body mass index is 31.89 kg/m.  Exam:  General: Alert and oriented x2, no acute distress Cardiovascular: Regular rate and rhythm, S1-S2, 2/6 systolic ejection murmur Respiratory: Clear to auscultation bilaterally Abdomen: Soft, nontender, nondistended, normoactive bowel sounds Musculoskeletal: No clubbing or cyanosis, trace pitting edema Skin: No skin breaks, tears or lesions Psychiatry: Some underlying dementia, short-term memory issues Neurology: No focal deficits   Data Reviewed: CBC: Recent Labs  Lab 07/21/20 1338 07/22/20 0135  WBC 14.5* 15.5*  NEUTROABS 8.9*  --   HGB 13.1 11.9*  HCT 41.3 37.2  MCV 97.2 95.1  PLT 241 208   Basic Metabolic Panel: Recent Labs  Lab 07/21/20 1338 07/22/20 0135  NA 136 138  K 3.4* 3.6  CL 103 102  CO2 25 28  GLUCOSE 151* 120*  BUN 23 18  CREATININE 0.95 0.99  CALCIUM 9.2 9.2  MG 1.8  --    GFR: Estimated Creatinine Clearance: 38.2 mL/min (by C-G formula based on SCr of 0.99 mg/dL). Liver Function Tests: Recent Labs  Lab 07/21/20 1338  AST 16  ALT 13  ALKPHOS 119  BILITOT 0.4  PROT 7.7  ALBUMIN 4.1   Recent Labs  Lab 07/21/20 1338  LIPASE 45   No results for input(s): AMMONIA in the last 168 hours. Coagulation Profile: No results for  input(s): INR, PROTIME in the last 168 hours. Cardiac Enzymes: No results for input(s): CKTOTAL, CKMB, CKMBINDEX, TROPONINI in the last 168 hours. BNP (last 3 results) No results for input(s): PROBNP in the last 8760 hours. HbA1C: No results for input(s): HGBA1C in the last 72 hours. CBG: No results for input(s): GLUCAP in the last 168 hours. Lipid Profile: No results for input(s): CHOL, HDL, LDLCALC, TRIG, CHOLHDL,  LDLDIRECT in the last 72 hours. Thyroid Function Tests: Recent Labs    07/22/20 0135  TSH 0.407   Anemia Panel: Recent Labs    07/22/20 0135  VITAMINB12 392   Urine analysis:    Component Value Date/Time   COLORURINE STRAW (A) 07/21/2020 1339   APPEARANCEUR CLEAR 07/21/2020 1339   LABSPEC 1.004 (L) 07/21/2020 1339   PHURINE 5.0 07/21/2020 1339   GLUCOSEU NEGATIVE 07/21/2020 1339   HGBUR SMALL (A) 07/21/2020 1339   BILIRUBINUR NEGATIVE 07/21/2020 1339   KETONESUR NEGATIVE 07/21/2020 1339   PROTEINUR NEGATIVE 07/21/2020 1339   NITRITE NEGATIVE 07/21/2020 1339   LEUKOCYTESUR NEGATIVE 07/21/2020 1339   Sepsis Labs: @LABRCNTIP (procalcitonin:4,lacticidven:4)  ) Recent Results (from the past 240 hour(s))  Resp Panel by RT-PCR (Flu A&B, Covid) Nasopharyngeal Swab     Status: None   Collection Time: 07/21/20  3:40 PM   Specimen: Nasopharyngeal Swab; Nasopharyngeal(NP) swabs in vial transport medium  Result Value Ref Range Status   SARS Coronavirus 2 by RT PCR NEGATIVE NEGATIVE Final    Comment: (NOTE) SARS-CoV-2 target nucleic acids are NOT DETECTED.  The SARS-CoV-2 RNA is generally detectable in upper respiratory specimens during the acute phase of infection. The lowest concentration of SARS-CoV-2 viral copies this assay can detect is 138 copies/mL. A negative result does not preclude SARS-Cov-2 infection and should not be used as the sole basis for treatment or other patient management decisions. A negative result may occur with  improper specimen collection/handling, submission of specimen other than nasopharyngeal swab, presence of viral mutation(s) within the areas targeted by this assay, and inadequate number of viral copies(<138 copies/mL). A negative result must be combined with clinical observations, patient history, and epidemiological information. The expected result is Negative.  Fact Sheet for Patients:  09/21/20  Fact  Sheet for Healthcare Providers:  BloggerCourse.com  This test is no t yet approved or cleared by the [employer] FDA and  has been authorized for detection and/or diagnosis of SARS-CoV-2 by FDA under an Emergency Use Authorization (EUA). This EUA will remain  in effect (meaning this test can be used) for the duration of the COVID-19 declaration under Section 564(b)(1) of the Act, 21 U.S.C.section 360bbb-3(b)(1), unless the authorization is terminated  or revoked sooner.       Influenza A by PCR NEGATIVE NEGATIVE Final   Influenza B by PCR NEGATIVE NEGATIVE Final    Comment: (NOTE) The Xpert Xpress SARS-CoV-2/FLU/RSV plus assay is intended as an aid in the diagnosis of influenza from Nasopharyngeal swab specimens and should not be used as a sole basis for treatment. Nasal washings and aspirates are unacceptable for Xpert Xpress SARS-CoV-2/FLU/RSV testing.  Fact Sheet for Patients: [place]  Fact Sheet for Healthcare Providers: BloggerCourse.com  This test is not yet approved or cleared by the [employer] FDA and has been authorized for detection and/or diagnosis of SARS-CoV-2 by FDA under an Emergency Use Authorization (EUA). This EUA will remain in effect (meaning this test can be used) for the duration of the COVID-19 declaration under Section 564(b)(1) of the Act, 21 U.S.C. section  360bbb-3(b)(1), unless the authorization is terminated or revoked.  Performed at [facility], [address] [place], [place] [address]       Studies: CT Angio Head W or Wo Contrast  Result Date: 07/21/2020 CLINICAL DATA:  Neuro deficit, acute stroke suspected. EXAM: CT HEAD WITHOUT CONTRAST CT ANGIOGRAPHY OF THE HEAD AND NECK TECHNIQUE: Contiguous axial images were obtained from the base of the skull through the vertex without intravenous contrast. Multidetector CT imaging of the head and neck was performed  using the standard protocol during bolus administration of intravenous contrast. Multiplanar CT image reconstructions and MIPs were obtained to evaluate the vascular anatomy. Carotid stenosis measurements (when applicable) are obtained utilizing NASCET criteria, using the distal internal carotid diameter as the denominator. CONTRAST:  OMNIPAQUE IOHEXOL 350 MG/ML SOLN COMPARISON:  CT head June 18, 2020. FINDINGS: CT HEAD Brain: No evidence of acute large vascular territory infarction, hemorrhage, hydrocephalus, extra-axial collection or mass lesion/mass effect. Patchy white matter hypoattenuation, most likely related to chronic microvascular ischemic disease. Vascular: See below. Skull: No acute fracture. Sinuses/Orbits: Visualized sinuses are clear. No acute orbital findings. Other: No mastoid effusions. CTA NECK Aortic arch: Calcific atherosclerosis of the aorta and its branch vessels. Great vessel origins are patent. Right carotid system: No significant (greater than 50%) stenosis. Mild atherosclerosis at the carotid bifurcation. Tortuous ICA. Left carotid system: No significant (greater than 50%) stenosis. Mild atherosclerosis at the carotid bifurcation. Tortuous ICA. Vertebral arteries:Co dominant. Approximately 40% narrowing of the left vertebral artery origin which is due to mass effect from the adjacent vertebral body (the left vertebral artery originates from the posterior aspect of the left subclavian artery and courses anteromedially between the subclavian artery and the vertebral body). Mildly dolichoectasia attic vertebrobasilar system. Other neck: Heterogeneous thyroid with large (4.2 cm) right thyroid nodule. Additional smaller thyroid nodules. Visualized lung apices are clear. CTA HEAD Anterior circulation: Bilateral intracranial ICAs, MCAs, and ACAs are patent without proximal flow limiting stenosis. Mild for age bilateral intracranial ICA calcific atherosclerosis. No aneurysm identified.  Posterior circulation: Bilateral intradural vertebral arteries, basilar artery, and bilateral posterior cerebral arteries are patent without proximal flow limiting stenosis. Venous sinuses: Visualized dural venous sinuses appear patent. IMPRESSION: CT Head: No evidence of acute intracranial abnormality. CTA Head: 1. No large vessel occlusion or proximal flow limiting stenosis. 2. Mildly dolichoectasia of the vertebrobasilar system. CTA Neck: 1. Approximately 40% narrowing of the left vertebral artery origin which is due to mass effect from the adjacent vertebral body (the left vertebral artery originates from the posterior aspect of the left subclavian artery and courses anteromedially between the subclavian artery and the vertebral body). 2. Otherwise, no significant stenosis in the neck. 3. Heterogeneous thyroid with large (4.2 cm) right thyroid nodule. Recommend thyroid US (ref: J Am Coll Radiol. 2015 Feb;12(2): 143-50). Electronically Signed   By: Feliberto Harts MD   On: 07/21/2020 16:24   CT Angio Neck W and/or Wo Contrast  Result Date: 07/21/2020 CLINICAL DATA:  Neuro deficit, acute stroke suspected. EXAM: CT HEAD WITHOUT CONTRAST CT ANGIOGRAPHY OF THE HEAD AND NECK TECHNIQUE: Contiguous axial images were obtained from the base of the skull through the vertex without intravenous contrast. Multidetector CT imaging of the head and neck was performed using the standard protocol during bolus administration of intravenous contrast. Multiplanar CT image reconstructions and MIPs were obtained to evaluate the vascular anatomy. Carotid stenosis measurements (when applicable) are obtained utilizing NASCET criteria, using the distal internal carotid diameter as the denominator.  CONTRAST:  OMNIPAQUE IOHEXOL 350 MG/ML SOLN COMPARISON:  CT head June 18, 2020. FINDINGS: CT HEAD Brain: No evidence of acute large vascular territory infarction, hemorrhage, hydrocephalus, extra-axial collection or mass lesion/mass  effect. Patchy white matter hypoattenuation, most likely related to chronic microvascular ischemic disease. Vascular: See below. Skull: No acute fracture. Sinuses/Orbits: Visualized sinuses are clear. No acute orbital findings. Other: No mastoid effusions. CTA NECK Aortic arch: Calcific atherosclerosis of the aorta and its branch vessels. Great vessel origins are patent. Right carotid system: No significant (greater than 50%) stenosis. Mild atherosclerosis at the carotid bifurcation. Tortuous ICA. Left carotid system: No significant (greater than 50%) stenosis. Mild atherosclerosis at the carotid bifurcation. Tortuous ICA. Vertebral arteries:Co dominant. Approximately 40% narrowing of the left vertebral artery origin which is due to mass effect from the adjacent vertebral body (the left vertebral artery originates from the posterior aspect of the left subclavian artery and courses anteromedially between the subclavian artery and the vertebral body). Mildly dolichoectasia attic vertebrobasilar system. Other neck: Heterogeneous thyroid with large (4.2 cm) right thyroid nodule. Additional smaller thyroid nodules. Visualized lung apices are clear. CTA HEAD Anterior circulation: Bilateral intracranial ICAs, MCAs, and ACAs are patent without proximal flow limiting stenosis. Mild for age bilateral intracranial ICA calcific atherosclerosis. No aneurysm identified. Posterior circulation: Bilateral intradural vertebral arteries, basilar artery, and bilateral posterior cerebral arteries are patent without proximal flow limiting stenosis. Venous sinuses: Visualized dural venous sinuses appear patent. IMPRESSION: CT Head: No evidence of acute intracranial abnormality. CTA Head: 1. No large vessel occlusion or proximal flow limiting stenosis. 2. Mildly dolichoectasia of the vertebrobasilar system. CTA Neck: 1. Approximately 40% narrowing of the left vertebral artery origin which is due to mass effect from the adjacent vertebral  body (the left vertebral artery originates from the posterior aspect of the left subclavian artery and courses anteromedially between the subclavian artery and the vertebral body). 2. Otherwise, no significant stenosis in the neck. 3. Heterogeneous thyroid with large (4.2 cm) right thyroid nodule. Recommend thyroid US (ref: J Am Coll Radiol. 2015 Feb;12(2): 143-50). Electronically Signed   By: Feliberto Harts MD   On: 07/21/2020 16:24   MR BRAIN WO CONTRAST  Result Date: 07/22/2020 CLINICAL DATA:  87 year old female with episodes of altered mental status, confusion, loss of speech and headache. EXAM: MRI HEAD WITHOUT CONTRAST TECHNIQUE: Multiplanar, multiecho pulse sequences of the brain and surrounding structures were obtained without intravenous contrast. COMPARISON:  CTA head and neck 07/21/2020, head CT 06/18/2020. FINDINGS: Brain: No restricted diffusion to suggest acute infarction. No midline shift, mass effect, evidence of mass lesion, ventriculomegaly, extra-axial collection or acute intracranial hemorrhage. Cervicomedullary junction and pituitary are within normal limits. Cerebral volume is within normal limits for age. Largely normal for age gray and white matter signal throughout the brain. There is mild nonspecific periventricular white matter and central pontine T2 and FLAIR hyperintensity. No cortical encephalomalacia or chronic cerebral blood products identified. Mild T2 heterogeneity in the deep gray nuclei appears mostly due to perivascular spaces. Negative cerebellum. Vascular: Major intracranial vascular flow voids are preserved with a moderate degree of generalized intracranial artery ectasia. Skull and upper cervical spine: Visible cervical spine is negative for age. Visualized bone marrow signal is within normal limits. Sinuses/Orbits: Postoperative changes to both globes, otherwise negative. Paranasal Visualized paranasal sinuses and mastoids are stable and well aerated. Other: Visible  internal auditory structures appear grossly normal. Negative visible scalp and face. IMPRESSION: No acute intracranial abnormality and largely normal for age noncontrast  MRI appearance of the brain. Electronically Signed   By: Odessa Fleming M.D.   On: 07/22/2020 05:04   CT ABDOMEN PELVIS W CONTRAST  Result Date: 07/21/2020 CLINICAL DATA:  Nausea and vomiting EXAM: CT ABDOMEN AND PELVIS WITH CONTRAST TECHNIQUE: Multidetector CT imaging of the abdomen and pelvis was performed using the standard protocol following bolus administration of intravenous contrast. CONTRAST:  OMNIPAQUE IOHEXOL 350 MG/ML SOLN COMPARISON:  None. FINDINGS: Lower chest: Included lung bases are clear. Heart size is within normal limits. Coronary artery calcification. Hepatobiliary: No focal liver abnormality is seen. No gallstones, gallbladder wall thickening, or biliary dilatation. Pancreas: Unremarkable. No pancreatic ductal dilatation or surrounding inflammatory changes. Spleen: Normal in size without focal abnormality. Adrenals/Urinary Tract: Unremarkable adrenal glands. Bilateral renal cortical atrophy with multiple small rounded low-attenuation lesions, likely cysts although some are too small to definitively characterize. No renal stone or hydronephrosis. Urinary bladder within normal limits. Stomach/Bowel: Stomach is within normal limits. Moderate-sized duodenal diverticulum measuring approximately 4.5 cm. The appendix is not visualized and may be surgically absent. Extensive colonic diverticulosis. No evidence of bowel wall thickening, distention, or inflammatory changes. Vascular/Lymphatic: Extensive aortoiliac atherosclerosis without aneurysm. No abdominopelvic lymphadenopathy. Reproductive: Anteverted uterus. Mildly prominent endometrial stripe. No adnexal masses. Other: No free fluid. No abdominopelvic fluid collection. No pneumoperitoneum. Small supraumbilical abdominal wall hernia with a small portion of transverse colon  protruding into the hernia sac. Musculoskeletal: Prior right hip ORIF. Multilevel degenerative changes of the lumbar spine. No acute osseous findings. No suspicious bony lesion. IMPRESSION: 1. No acute abdominopelvic findings. 2. Extensive colonic diverticulosis without evidence of acute diverticulitis. 3. Small supraumbilical abdominal wall hernia with a small portion of transverse colon protruding into the hernia sac. Negative for bowel obstruction. 4. Mildly prominent endometrial stripe. Further evaluation with pelvic ultrasound can be considered on a clinical basis given patient's age and comorbidities. 5. Aortic atherosclerosis (ICD10-I70.0). Electronically Signed   By: Duanne Guess D.O.   On: 07/21/2020 16:44   EEG adult  Result Date: 07/22/2020 Charlsie Quest, MD     07/22/2020  1:26 PM Patient Name: Sonny Poth MRN: [id number] Epilepsy Attending: Charlsie Quest Referring Physician/Provider: Dr Kennis Carina Date: 07/22/2020 Duration: 23.09 mins Patient history:  87 year old female presenting with a one month history of recurrent spells of motor freezing and word finding difficulty.  EEG to evaluate for seizures. Level of alertness: Awake AEDs during EEG study: None Technical aspects: This EEG study was done with scalp electrodes positioned according to the 10-20 International system of electrode placement. Electrical activity was acquired at a sampling rate of 500Hz  and reviewed with a high frequency filter of 70Hz  and a low frequency filter of 1Hz . EEG data were recorded continuously and digitally stored. Description: The posterior dominant rhythm consists of 8Hz  activity of moderate voltage (25-35 uV) seen predominantly in posterior head regions, symmetric and reactive to eye opening and eye closing. EEG showed intermittent generalized 3 to 6 Hz theta-delta slowing. Hyperventilation and photic stimulation were not performed.   ABNORMALITY - Intermittent slow, generalized IMPRESSION: This  study is suggestive of mild diffuse encephalopathy, nonspecific etiology.  No seizures or epileptiform discharges seen during the study. Priyanka    Scheduled Meds:  amLODipine  5 mg Oral Daily   atorvastatin  40 mg Oral QHS   busPIRone  10 mg Oral BID   donepezil  10 mg Oral QHS   enoxaparin (LOVENOX) injection  40 mg Subcutaneous QHS   famotidine  40 mg Oral Daily   levothyroxine  25 mcg Oral Q0600   venlafaxine XR  150 mg Oral Q breakfast    Continuous Infusions:   LOS: 0 days     Hollice EspySendil K Oneda Duffett, MD [facility]   07/22/2020, 2:43 PM

## 2020-07-22 NOTE — Consult Note (Addendum)
NEURO HOSPITALIST CONSULT NOTE   Requestig physician: Dr. Mariea Clonts  Reason for Consult: Spells of motor freezing with word finding difficulty  History obtained from:  Patient and Chart     HPI:                                                                                                                                          Susan Osborne is an 87 y.o. female who with a PMHx of arthritis, dementia, HTN and stroke who presented to the [facility] ED with transient neurological symptoms for the past few weeks to up a month. The intermittent symptoms recur then completely resolve. Family feels that the symptoms are concerning for stroke. When symptoms occur, she will seem to be confused, have trouble speaking and her eyes will become dilated. Spells last for a maximum of about 10 minutes. On Wednesday morning she had an episode where she froze suddenly while walking -  the patient felt as though her entire body felt numb and she states that she could not move despite wanting to; she remained standing, retaining postural control, but could not walk or otherwise move. During this spell, she also could not speak correctly. After each of the episodes, she will get a headache, and with the spell Wednesday morning, she also had some vomiting.  She has had some upper abdominal discomfort as well. In the ED, the patient was conversant and at her mental status baseline in the context of mild dementia. She states that she was evaluated at an outside hospital where she was told she might be having TIAs; they advised her to be admitted, but apparently there was not a room available in the hospital so she was discharged. Evaluation was in [place] - records unavailable - for what were felt to be possible TIAs. Case was discussed with Dr. Wilford Corner who noted that given the description of stereotypical episodes and advanced age, DDx included seizures versus left hemispheric hypoperfusion. He recommended  EEG, CTA head and neck and MRI of the brain, as well as to check B12, TSH and RPR. She was transferred to [facility] for a formal neurological consultation due to unavailable routine neurological consultation at [facility] this week.  Past Medical History:  Diagnosis Date   Arthritis    Dementia (HCC)    Hypertension    Stroke [facility])     Past Surgical History:  Procedure Laterality Date   ABDOMINAL HYSTERECTOMY     FOOT SURGERY Right    HIP SURGERY Right    REPLACEMENT TOTAL KNEE Right     History reviewed. No pertinent family history.            Social History:  reports that she has quit smoking. Her smoking use included  cigarettes. She has never been exposed to tobacco smoke. She has never used smokeless tobacco. No history on file for alcohol use and drug use.  Allergies  Allergen Reactions   Codeine     MEDICATIONS:                                                                                                                     Prior to Admission:  Medications Prior to Admission  Medication Sig Dispense Refill Last Dose   amLODipine (NORVASC) 5 MG tablet Take 5 mg by mouth daily.   07/21/2020   atorvastatin (LIPITOR) 40 MG tablet Take 40 mg by mouth at bedtime.   07/20/2020   busPIRone (BUSPAR) 10 MG tablet Take 10 mg by mouth 2 (two) times daily.   07/21/2020   Calcium Carb-Cholecalciferol (CALCIUM 500 + D) 500-125 MG-UNIT TABS Take 1 tablet by mouth daily.   07/21/2020   Cholecalciferol 50 MCG (2000 UT) TABS Take 1 tablet by mouth daily.   Past Month   dipyridamole-aspirin (AGGRENOX) 200-25 MG 12hr capsule Take 1 capsule by mouth 2 (two) times daily.   07/21/2020 at 0800   donepezil (ARICEPT) 10 MG tablet Take 10 mg by mouth at bedtime.   07/20/2020   famotidine (PEPCID) 40 MG tablet Take 40 mg by mouth daily.   07/21/2020   HYDROcodone-acetaminophen (NORCO/VICODIN) 5-325 MG tablet Take 1 tablet by mouth in the morning, at noon, and at bedtime.   07/21/2020    levothyroxine (SYNTHROID) 25 MCG tablet Take 25 mcg by mouth daily before breakfast.   07/21/2020   ondansetron (ZOFRAN) 4 MG tablet Take 4 mg by mouth every 8 (eight) hours as needed for nausea or vomiting.      venlafaxine XR (EFFEXOR-XR) 150 MG 24 hr capsule Take by mouth daily with breakfast.   07/20/2020   amoxicillin (AMOXIL) 875 MG tablet amoxicillin 875 mg tablet (Patient not taking: No sig reported)      celecoxib (CELEBREX) 200 MG capsule Celebrex 200 mg capsule (Patient not taking: No sig reported)      citalopram (CELEXA) 10 MG tablet citalopram 10 mg tablet (Patient not taking: No sig reported)   Not Taking   clotrimazole-betamethasone (LOTRISONE) cream clotrimazole-betamethasone 1 %-0.05 % topical cream (Patient not taking: No sig reported)      Cyanocobalamin 5000 MCG SUBL Place under the tongue. (Patient not taking: No sig reported)      DULoxetine (CYMBALTA) 30 MG capsule Take 30 mg by mouth at bedtime. (Patient not taking: No sig reported)      enoxaparin (LOVENOX) 30 MG/0.3ML injection SMARTSIG:0.3 Milliliter(s) SUB-Q Daily (Patient not taking: No sig reported)      fluconazole (DIFLUCAN) 100 MG tablet fluconazole 100 mg tablet (Patient not taking: No sig reported)      furosemide (LASIX) 20 MG tablet furosemide 20 mg tablet  TK 1 T PO QAM (Patient not taking: No sig reported)      lidocaine (XYLOCAINE) 5 % ointment lidocaine 5 % topical  ointment (Patient not taking: No sig reported)      mupirocin ointment (BACTROBAN) 2 % mupirocin 2 % topical ointment (Patient not taking: No sig reported)      omeprazole (PRILOSEC) 40 MG capsule omeprazole 40 mg capsule,delayed release (Patient not taking: No sig reported)      oxyCODONE (OXY IR/ROXICODONE) 5 MG immediate release tablet Take 5 mg by mouth every 6 (six) hours as needed for moderate pain. (Patient not taking: No sig reported)   Not Taking   potassium chloride (KLOR-CON) 10 MEQ tablet Take 10 mEq by mouth daily. (Patient not taking:  No sig reported)      scopolamine (TRANSDERM-SCOP, 1.5 MG,) 1 MG/3DAYS Place 1 patch (1.5 mg total) onto the skin every 3 (three) days. (Patient not taking: No sig reported) 10 patch 12 Not Taking   triamcinolone cream (KENALOG) 0.1 % triamcinolone acetonide 0.1 % topical cream (Patient not taking: No sig reported)      Scheduled:  amLODipine  5 mg Oral Daily   atorvastatin  40 mg Oral QHS   busPIRone  10 mg Oral BID   donepezil  10 mg Oral QHS   enoxaparin (LOVENOX) injection  40 mg Subcutaneous QHS   famotidine  40 mg Oral Daily   levothyroxine  25 mcg Oral Q0600   venlafaxine XR  150 mg Oral Q breakfast     ROS:                                                                                                                                       As per HPI. She does not endorse any urinary incontinence, tongue biting, overt shaking spells or lateralized weakness.    Blood pressure 129/62, pulse 99, temperature 98.2 F (36.8 C), temperature source Oral, resp. rate 15, height  (1.6 m), weight 81.6 kg, SpO2 98 %.   General Examination:                                                                                                       Physical Exam  HEENT-  [place]/AT    Lungs- Respirations unlabored Extremities- Warm and well perfused  Neurological Examination Mental Status: Awake and alert. Fully oriented. Speech fluent without evidence of aphasia. Able to follow all commands without difficulty. Cranial Nerves: II: Temporal visual fields intact. No extinction to DSS. PERRL.  III,IV, VI: No ptosis. EOMI. No nystagmus.   V,VII: Smile symmetric, facial temp sensation equal bilaterally VIII: Hearing intact  to voice IX,X: No hypophonia XI: Symmetric XII: Midline tongue extension Motor: Right : Upper extremity   5/5    Left:     Upper extremity   5/5  Lower extremity   5/5     Lower extremity   5/5 Sensory: Temp and light touch intact throughout, bilaterally. No extinction to  DSS.  Deep Tendon Reflexes: 2+ and symmetric upper extremities. 0 right patellar, trace left patellar Plantars: Right: downgoing   Left: downgoing Cerebellar: No ataxia with FNF bilaterally  Gait: Deferred   Lab Results: Basic Metabolic Panel: Recent Labs  Lab 07/21/20 1338  NA 136  K 3.4*  CL 103  CO2 25  GLUCOSE 151*  BUN 23  CREATININE 0.95  CALCIUM 9.2  MG 1.8    CBC: Recent Labs  Lab 07/21/20 1338 07/22/20 0135  WBC 14.5* 15.5*  NEUTROABS 8.9*  --   HGB 13.1 11.9*  HCT 41.3 37.2  MCV 97.2 95.1  PLT 241 208    Cardiac Enzymes: No results for input(s): CKTOTAL, CKMB, CKMBINDEX, TROPONINI in the last 168 hours.  Lipid Panel: No results for input(s): CHOL, TRIG, HDL, CHOLHDL, VLDL, LDLCALC in the last 168 hours.  Imaging: CT Angio Head W or Wo Contrast  Result Date: 07/21/2020 CLINICAL DATA:  Neuro deficit, acute stroke suspected. EXAM: CT HEAD WITHOUT CONTRAST CT ANGIOGRAPHY OF THE HEAD AND NECK TECHNIQUE: Contiguous axial images were obtained from the base of the skull through the vertex without intravenous contrast. Multidetector CT imaging of the head and neck was performed using the standard protocol during bolus administration of intravenous contrast. Multiplanar CT image reconstructions and MIPs were obtained to evaluate the vascular anatomy. Carotid stenosis measurements (when applicable) are obtained utilizing NASCET criteria, using the distal internal carotid diameter as the denominator. CONTRAST:  OMNIPAQUE IOHEXOL 350 MG/ML SOLN COMPARISON:  CT head June 18, 2020. FINDINGS: CT HEAD Brain: No evidence of acute large vascular territory infarction, hemorrhage, hydrocephalus, extra-axial collection or mass lesion/mass effect. Patchy white matter hypoattenuation, most likely related to chronic microvascular ischemic disease. Vascular: See below. Skull: No acute fracture. Sinuses/Orbits: Visualized sinuses are clear. No acute orbital findings. Other: No mastoid  effusions. CTA NECK Aortic arch: Calcific atherosclerosis of the aorta and its branch vessels. Great vessel origins are patent. Right carotid system: No significant (greater than 50%) stenosis. Mild atherosclerosis at the carotid bifurcation. Tortuous ICA. Left carotid system: No significant (greater than 50%) stenosis. Mild atherosclerosis at the carotid bifurcation. Tortuous ICA. Vertebral arteries:Co dominant. Approximately 40% narrowing of the left vertebral artery origin which is due to mass effect from the adjacent vertebral body (the left vertebral artery originates from the posterior aspect of the left subclavian artery and courses anteromedially between the subclavian artery and the vertebral body). Mildly dolichoectasia attic vertebrobasilar system. Other neck: Heterogeneous thyroid with large (4.2 cm) right thyroid nodule. Additional smaller thyroid nodules. Visualized lung apices are clear. CTA HEAD Anterior circulation: Bilateral intracranial ICAs, MCAs, and ACAs are patent without proximal flow limiting stenosis. Mild for age bilateral intracranial ICA calcific atherosclerosis. No aneurysm identified. Posterior circulation: Bilateral intradural vertebral arteries, basilar artery, and bilateral posterior cerebral arteries are patent without proximal flow limiting stenosis. Venous sinuses: Visualized dural venous sinuses appear patent. IMPRESSION: CT Head: No evidence of acute intracranial abnormality. CTA Head: 1. No large vessel occlusion or proximal flow limiting stenosis. 2. Mildly dolichoectasia of the vertebrobasilar system. CTA Neck: 1. Approximately 40% narrowing of the left vertebral artery origin which is due  to mass effect from the adjacent vertebral body (the left vertebral artery originates from the posterior aspect of the left subclavian artery and courses anteromedially between the subclavian artery and the vertebral body). 2. Otherwise, no significant stenosis in the neck. 3.  Heterogeneous thyroid with large (4.2 cm) right thyroid nodule. Recommend thyroid US (ref: J Am Coll Radiol. 2015 Feb;12(2): 143-50). Electronically Signed   By: Feliberto Harts MD   On: 07/21/2020 16:24   CT Angio Neck W and/or Wo Contrast  Result Date: 07/21/2020 CLINICAL DATA:  Neuro deficit, acute stroke suspected. EXAM: CT HEAD WITHOUT CONTRAST CT ANGIOGRAPHY OF THE HEAD AND NECK TECHNIQUE: Contiguous axial images were obtained from the base of the skull through the vertex without intravenous contrast. Multidetector CT imaging of the head and neck was performed using the standard protocol during bolus administration of intravenous contrast. Multiplanar CT image reconstructions and MIPs were obtained to evaluate the vascular anatomy. Carotid stenosis measurements (when applicable) are obtained utilizing NASCET criteria, using the distal internal carotid diameter as the denominator. CONTRAST:  OMNIPAQUE IOHEXOL 350 MG/ML SOLN COMPARISON:  CT head June 18, 2020. FINDINGS: CT HEAD Brain: No evidence of acute large vascular territory infarction, hemorrhage, hydrocephalus, extra-axial collection or mass lesion/mass effect. Patchy white matter hypoattenuation, most likely related to chronic microvascular ischemic disease. Vascular: See below. Skull: No acute fracture. Sinuses/Orbits: Visualized sinuses are clear. No acute orbital findings. Other: No mastoid effusions. CTA NECK Aortic arch: Calcific atherosclerosis of the aorta and its branch vessels. Great vessel origins are patent. Right carotid system: No significant (greater than 50%) stenosis. Mild atherosclerosis at the carotid bifurcation. Tortuous ICA. Left carotid system: No significant (greater than 50%) stenosis. Mild atherosclerosis at the carotid bifurcation. Tortuous ICA. Vertebral arteries:Co dominant. Approximately 40% narrowing of the left vertebral artery origin which is due to mass effect from the adjacent vertebral body (the left  vertebral artery originates from the posterior aspect of the left subclavian artery and courses anteromedially between the subclavian artery and the vertebral body). Mildly dolichoectasia attic vertebrobasilar system. Other neck: Heterogeneous thyroid with large (4.2 cm) right thyroid nodule. Additional smaller thyroid nodules. Visualized lung apices are clear. CTA HEAD Anterior circulation: Bilateral intracranial ICAs, MCAs, and ACAs are patent without proximal flow limiting stenosis. Mild for age bilateral intracranial ICA calcific atherosclerosis. No aneurysm identified. Posterior circulation: Bilateral intradural vertebral arteries, basilar artery, and bilateral posterior cerebral arteries are patent without proximal flow limiting stenosis. Venous sinuses: Visualized dural venous sinuses appear patent. IMPRESSION: CT Head: No evidence of acute intracranial abnormality. CTA Head: 1. No large vessel occlusion or proximal flow limiting stenosis. 2. Mildly dolichoectasia of the vertebrobasilar system. CTA Neck: 1. Approximately 40% narrowing of the left vertebral artery origin which is due to mass effect from the adjacent vertebral body (the left vertebral artery originates from the posterior aspect of the left subclavian artery and courses anteromedially between the subclavian artery and the vertebral body). 2. Otherwise, no significant stenosis in the neck. 3. Heterogeneous thyroid with large (4.2 cm) right thyroid nodule. Recommend thyroid US (ref: J Am Coll Radiol. 2015 Feb;12(2): 143-50). Electronically Signed   By: Feliberto Harts MD   On: 07/21/2020 16:24   CT ABDOMEN PELVIS W CONTRAST  Result Date: 07/21/2020 CLINICAL DATA:  Nausea and vomiting EXAM: CT ABDOMEN AND PELVIS WITH CONTRAST TECHNIQUE: Multidetector CT imaging of the abdomen and pelvis was performed using the standard protocol following bolus administration of intravenous contrast. CONTRAST:  OMNIPAQUE IOHEXOL 350 MG/ML  SOLN  COMPARISON:  None. FINDINGS: Lower chest: Included lung bases are clear. Heart size is within normal limits. Coronary artery calcification. Hepatobiliary: No focal liver abnormality is seen. No gallstones, gallbladder wall thickening, or biliary dilatation. Pancreas: Unremarkable. No pancreatic ductal dilatation or surrounding inflammatory changes. Spleen: Normal in size without focal abnormality. Adrenals/Urinary Tract: Unremarkable adrenal glands. Bilateral renal cortical atrophy with multiple small rounded low-attenuation lesions, likely cysts although some are too small to definitively characterize. No renal stone or hydronephrosis. Urinary bladder within normal limits. Stomach/Bowel: Stomach is within normal limits. Moderate-sized duodenal diverticulum measuring approximately 4.5 cm. The appendix is not visualized and may be surgically absent. Extensive colonic diverticulosis. No evidence of bowel wall thickening, distention, or inflammatory changes. Vascular/Lymphatic: Extensive aortoiliac atherosclerosis without aneurysm. No abdominopelvic lymphadenopathy. Reproductive: Anteverted uterus. Mildly prominent endometrial stripe. No adnexal masses. Other: No free fluid. No abdominopelvic fluid collection. No pneumoperitoneum. Small supraumbilical abdominal wall hernia with a small portion of transverse colon protruding into the hernia sac. Musculoskeletal: Prior right hip ORIF. Multilevel degenerative changes of the lumbar spine. No acute osseous findings. No suspicious bony lesion. IMPRESSION: 1. No acute abdominopelvic findings. 2. Extensive colonic diverticulosis without evidence of acute diverticulitis. 3. Small supraumbilical abdominal wall hernia with a small portion of transverse colon protruding into the hernia sac. Negative for bowel obstruction. 4. Mildly prominent endometrial stripe. Further evaluation with pelvic ultrasound can be considered on a clinical basis given patient's age and comorbidities. 5.  Aortic atherosclerosis (ICD10-I70.0). Electronically Signed   By: Duanne Guess D.O.   On: 07/21/2020 16:44   DG Knee Complete 4 Views Right  Result Date: 07/21/2020 CLINICAL DATA:  Knee injury EXAM: RIGHT KNEE - COMPLETE 4+ VIEW COMPARISON:  None. FINDINGS: Prior right knee replacement. No hardware complicating feature. No acute bony abnormality. Specifically, no fracture, subluxation, or dislocation. No joint effusion. IMPRESSION: Prior right knee replacement.  No acute bony abnormality. Electronically Signed   By: Charlett Nose M.D.   On: 07/21/2020 14:12     Assessment: 87 year old female presenting with a one month history of recurrent spells of motor freezing and word finding difficulty 1. Exam reveals no focal neurological deficit.  2. CT Head: No evidence of acute intracranial abnormality.  3. CTA Head: No large vessel occlusion or proximal flow limiting stenosis. Mild dolichoectasia of the vertebrobasilar system.  4. CTA Neck: Approximately 40% narrowing of the left vertebral artery origin which is due to mass effect from the adjacent vertebral body (the left vertebral artery originates from the posterior aspect of the left subclavian artery and courses anteromedially between the subclavian artery and the vertebral body). Otherwise, no significant stenosis in the neck.  5. Also noted on CTA neck is a heterogeneous thyroid with large (4.2 cm) right thyroid nodule. Radiology recommends thyroid US.  6. DDx includes seizures versus transient left hemispheric hypoperfusion.    Recommendations: 1. MRI of the brain 2. Check B12, TSH and RPR. 3, EEG 4. Thyroid ultrasound.  5. If above is unrevealing, may need outpatient ambulatory EEG to capture a spell 6. Continue Aggrenox  Addendum: MRI brain reveals no acute intracranial abnormality.Largely normal for age noncontrast MRI appearance of the brain.   Electronically signed: Dr. Caryl Pina 07/22/2020, 2:55 AM

## 2020-07-22 NOTE — Procedures (Addendum)
Patient Name: Susan Osborne  MRN: [id number]  Epilepsy Attending: Charlsie Quest  Referring Physician/Provider: Dr Kennis Carina  Date: 07/22/2020 Duration: 23.09 mins  Patient history:  87 year old female presenting with a one month history of recurrent spells of motor freezing and word finding difficulty.  EEG to evaluate for seizures.  Level of alertness: Awake  AEDs during EEG study: None  Technical aspects: This EEG study was done with scalp electrodes positioned according to the 10-20 International system of electrode placement. Electrical activity was acquired at a sampling rate of 500Hz  and reviewed with a high frequency filter of 70Hz  and a low frequency filter of 1Hz . EEG data were recorded continuously and digitally stored.   Description: The posterior dominant rhythm consists of 8Hz  activity of moderate voltage (25-35 uV) seen predominantly in posterior head regions, symmetric and reactive to eye opening and eye closing. EEG showed intermittent generalized 3 to 6 Hz theta-delta slowing. Hyperventilation and photic stimulation were not performed.     ABNORMALITY - Intermittent slow, generalized  IMPRESSION: This study is suggestive of mild diffuse encephalopathy, nonspecific etiology.  No seizures or epileptiform discharges seen during the study.  Slayton Lubitz 

## 2020-07-22 NOTE — Progress Notes (Signed)
EEG complete - results pending 

## 2020-07-23 DIAGNOSIS — E041 Nontoxic single thyroid nodule: Secondary | ICD-10-CM

## 2020-07-23 DIAGNOSIS — R404 Transient alteration of awareness: Secondary | ICD-10-CM

## 2020-07-23 DIAGNOSIS — E039 Hypothyroidism, unspecified: Secondary | ICD-10-CM

## 2020-07-23 LAB — CBC WITH DIFFERENTIAL/PLATELET
Abs Immature Granulocytes: 0.06 10*3/uL (ref 0.00–0.07)
Basophils Absolute: 0.1 10*3/uL (ref 0.0–0.1)
Basophils Relative: 1 %
Eosinophils Absolute: 0.4 10*3/uL (ref 0.0–0.5)
Eosinophils Relative: 4 %
HCT: 36.1 % (ref 36.0–46.0)
Hemoglobin: 11.4 g/dL — ABNORMAL LOW (ref 12.0–15.0)
Immature Granulocytes: 1 %
Lymphocytes Relative: 25 %
Lymphs Abs: 2.5 10*3/uL (ref 0.7–4.0)
MCH: 30.3 pg (ref 26.0–34.0)
MCHC: 31.6 g/dL (ref 30.0–36.0)
MCV: 96 fL (ref 80.0–100.0)
Monocytes Absolute: 1.2 10*3/uL — ABNORMAL HIGH (ref 0.1–1.0)
Monocytes Relative: 12 %
Neutro Abs: 5.6 10*3/uL (ref 1.7–7.7)
Neutrophils Relative %: 57 %
Platelets: 198 10*3/uL (ref 150–400)
RBC: 3.76 MIL/uL — ABNORMAL LOW (ref 3.87–5.11)
RDW: 16.6 % — ABNORMAL HIGH (ref 11.5–15.5)
WBC: 9.8 10*3/uL (ref 4.0–10.5)
nRBC: 0 % (ref 0.0–0.2)

## 2020-07-23 NOTE — Progress Notes (Signed)
PROGRESS NOTE  Susan Osborne YIF:[id number] DOB: 1927/11/27 DOA: 07/21/2020 PCP: Theodoro Kos, MD  HPI/Recap of past 8 hours: 87 year old female past medical history of obesity, dementia and hypertension and previous CVA presented to the emergency room on 7/12 with reports of intermittent episodes of difficulty speaking as well as confusion over the past month.  Patient also had an episode on day of arrival to the ED of a headache after a spell where she stopped walking and could not move and felt her whole body getting.  In the emergency room, CT angiogram of head and neck without large vessel occlusion.  White blood cell count noted at 14.5.  Admitted to the hospitalist service.  Neurology consulted.  EEG done, with no evidence of seizure noted.  Recommendation is for outpatient ambulatory EEG.  CT of neck also noted incidental enlargement of right thyroid lobe.  TSH normal and thyroid ultrasound recommended biopsy.  After discussion with patient and family, they would like to proceed with biopsy.  Interventional radiology consulted.  Patient herself otherwise with no complaints.  Assessment/Plan: Principal Problem:   AMS (altered mental status)/acute metabolic encephalopathy with chronic underlying dementia: MRI brain unremarkable.  EEG notes no evidence of seizures.  Checking B12 plus TSH and RPR.  Continue Aricept. Active Problems:   HTN (hypertension): Blood pressure stable.    Obesity (BMI 30-39.9): Meets criteria BMI greater than 30.    Hyperlipidemia  Leukocytosis: Unclear etiology.  Stress margination?  Urine clear, no respiratory complaints and lung bases on abdominal CT unremarkable.  Procalcitonin level normal.  White blood cell count normal today.  Right thyroid nodule: Noted on CT angiogram of neck.  Thyroid ultrasound recommending biopsy which family would like to have.  TSH normal.  Interventional radiology consulted.  Code Status: Full code  Family Communication:  Updated daughter by phone.  Disposition Plan: Depending on when thyroid biopsy can be done.   Consultants: Neurology  Procedures: EEG done on 7/13: No evidence of acute seizures noted.  Generalized slow waves consistent with encephalopathy/dementia  Antimicrobials: None  DVT prophylaxis: Lovenox  Level of care: Telemetry Medical   Objective: Vitals:   07/23/20 0900 07/23/20 1434  BP: 135/90 128/68  Pulse: 76 76  Resp: 16 19  Temp:  97.9 F (36.6 C)  SpO2:  98%    Intake/Output Summary (Last 24 hours) at 07/23/2020 1601 Last data filed at 07/23/2020 1300 Gross per 24 hour  Intake 480 ml  Output --  Net 480 ml    Filed Weights   07/21/20 1239  Weight: 81.6 kg   Body mass index is 31.89 kg/m.  Exam:  General: Alert and oriented x2, no acute distress Cardiovascular: Regular rate and rhythm, S1-S2, 2/6 systolic ejection murmur Respiratory: Clear to auscultation bilaterally Abdomen: Soft, nontender, nondistended, normoactive bowel sounds Musculoskeletal: No clubbing or cyanosis, trace pitting edema Skin: No skin breaks, tears or lesions Psychiatry: Some underlying dementia, short-term memory issues Neurology: No focal deficits   Data Reviewed: CBC: Recent Labs  Lab 07/21/20 1338 07/22/20 0135 07/23/20 0154  WBC 14.5* 15.5* 9.8  NEUTROABS 8.9*  --  5.6  HGB 13.1 11.9* 11.4*  HCT 41.3 37.2 36.1  MCV 97.2 95.1 96.0  PLT 241 208 198    Basic Metabolic Panel: Recent Labs  Lab 07/21/20 1338 07/22/20 0135  NA 136 138  K 3.4* 3.6  CL 103 102  CO2 25 28  GLUCOSE 151* 120*  BUN 23 18  CREATININE 0.95 0.99  CALCIUM  9.2 9.2  MG 1.8  --     GFR: Estimated Creatinine Clearance: 38.2 mL/min (by C-G formula based on SCr of 0.99 mg/dL). Liver Function Tests: Recent Labs  Lab 07/21/20 1338  AST 16  ALT 13  ALKPHOS 119  BILITOT 0.4  PROT 7.7  ALBUMIN 4.1    Recent Labs  Lab 07/21/20 1338  LIPASE 45    No results for input(s): AMMONIA in  the last 168 hours. Coagulation Profile: No results for input(s): INR, PROTIME in the last 168 hours. Cardiac Enzymes: No results for input(s): CKTOTAL, CKMB, CKMBINDEX, TROPONINI in the last 168 hours. BNP (last 3 results) No results for input(s): PROBNP in the last 8760 hours. HbA1C: No results for input(s): HGBA1C in the last 72 hours. CBG: No results for input(s): GLUCAP in the last 168 hours. Lipid Profile: No results for input(s): CHOL, HDL, LDLCALC, TRIG, CHOLHDL, LDLDIRECT in the last 72 hours. Thyroid Function Tests: Recent Labs    07/22/20 0135  TSH 0.407    Anemia Panel: Recent Labs    07/22/20 0135  VITAMINB12 392    Urine analysis:    Component Value Date/Time   COLORURINE STRAW (A) 07/21/2020 1339   APPEARANCEUR CLEAR 07/21/2020 1339   LABSPEC 1.004 (L) 07/21/2020 1339   PHURINE 5.0 07/21/2020 1339   GLUCOSEU NEGATIVE 07/21/2020 1339   HGBUR SMALL (A) 07/21/2020 1339   BILIRUBINUR NEGATIVE 07/21/2020 1339   KETONESUR NEGATIVE 07/21/2020 1339   PROTEINUR NEGATIVE 07/21/2020 1339   NITRITE NEGATIVE 07/21/2020 1339   LEUKOCYTESUR NEGATIVE 07/21/2020 1339   Sepsis Labs: @LABRCNTIP (procalcitonin:4,lacticidven:4)  ) Recent Results (from the past 240 hour(s))  Resp Panel by RT-PCR (Flu A&B, Covid) Nasopharyngeal Swab     Status: None   Collection Time: 07/21/20  3:40 PM   Specimen: Nasopharyngeal Swab; Nasopharyngeal(NP) swabs in vial transport medium  Result Value Ref Range Status   SARS Coronavirus 2 by RT PCR NEGATIVE NEGATIVE Final    Comment: (NOTE) SARS-CoV-2 target nucleic acids are NOT DETECTED.  The SARS-CoV-2 RNA is generally detectable in upper respiratory specimens during the acute phase of infection. The lowest concentration of SARS-CoV-2 viral copies this assay can detect is 138 copies/mL. A negative result does not preclude SARS-Cov-2 infection and should not be used as the sole basis for treatment or other patient management  decisions. A negative result may occur with  improper specimen collection/handling, submission of specimen other than nasopharyngeal swab, presence of viral mutation(s) within the areas targeted by this assay, and inadequate number of viral copies(<138 copies/mL). A negative result must be combined with clinical observations, patient history, and epidemiological information. The expected result is Negative.  Fact Sheet for Patients:  09/21/20  Fact Sheet for Healthcare Providers:  BloggerCourse.com  This test is no t yet approved or cleared by the [place] FDA and  has been authorized for detection and/or diagnosis of SARS-CoV-2 by FDA under an Emergency Use Authorization (EUA). This EUA will remain  in effect (meaning this test can be used) for the duration of the COVID-19 declaration under Section 564(b)(1) of the Act, 21 U.S.C.section 360bbb-3(b)(1), unless the authorization is terminated  or revoked sooner.       Influenza A by PCR NEGATIVE NEGATIVE Final   Influenza B by PCR NEGATIVE NEGATIVE Final    Comment: (NOTE) The Xpert Xpress SARS-CoV-2/FLU/RSV plus assay is intended as an aid in the diagnosis of influenza from Nasopharyngeal swab specimens and should not be used as a sole basis for  treatment. Nasal washings and aspirates are unacceptable for Xpert Xpress SARS-CoV-2/FLU/RSV testing.  Fact Sheet for Patients: BloggerCourse.com  Fact Sheet for Healthcare Providers: SeriousBroker.it  This test is not yet approved or cleared by the [place] FDA and has been authorized for detection and/or diagnosis of SARS-CoV-2 by FDA under an Emergency Use Authorization (EUA). This EUA will remain in effect (meaning this test can be used) for the duration of the COVID-19 declaration under Section 564(b)(1) of the Act, 21 U.S.C. section 360bbb-3(b)(1), unless the  authorization is terminated or revoked.  Performed at [facility], [address], [place], [place] [address]       Studies: No results found.  Scheduled Meds:  amLODipine  5 mg Oral Daily   atorvastatin  40 mg Oral QHS   busPIRone  10 mg Oral BID   donepezil  10 mg Oral QHS   famotidine  40 mg Oral Daily   levothyroxine  25 mcg Oral Q0600   venlafaxine XR  150 mg Oral Q breakfast    Continuous Infusions:   LOS: 1 day     Hollice Espy, MD [facility]   07/23/2020, 4:01 PM

## 2020-07-23 NOTE — Progress Notes (Addendum)
Neurology Progress Note  Patient ID: Susan Osborne is a 87 y.o. with PMHx of  has a past medical history of Arthritis, Dementia (HCC), Hypertension, and Stroke (HCC).  Initially consulted for: one month history of recurrent spells of motor freezing and word finding difficulty  Major interval events/Subjective: No acute complaints, reports no recurrent spells since admission Generally feels "not that great" but just relates this to being in the hospital  Exam: Vitals:   07/22/20 2033 07/23/20 0434  BP: 121/66 (!) 123/58  Pulse: 87 73  Resp: 17 20  Temp: 99 F (37.2 C) 97.8 F (36.6 C)  SpO2: 96% 95%   Gen: In bed, comfortable  Resp: non-labored breathing, no grossly audible wheezing Cardiac: Perfusing extremities well  Abd: soft, nt Skin: Clean dry and intact visible skin.  Healed chronic right knee surgery scar  Neuro: MS: Alert, awake, converses without evidence of aphasia, oriented to [facility] but reports that it is March 2013, and appears to be oriented to situation based on conversation CN: Visual fields full to confrontation, external ocular movements intact, facial movements and sensation symmetric, tongue midline Motor: Bulk and tone are normal.  No pronator drift.  No decrementing finger tapping.  No pronator drift.  5/5 left lower hip flexion.  Pain limited right hip flexion Sensory: Intact to light touch throughout Coordination: Intact finger-to-nose bilaterally, heel-to-shin intact on the left leg and pain limited on the right  Pertinent Data: Lab Results  Component Value Date   VITAMINB12 392 07/22/2020    Lab Results  Component Value Date   TSH 0.407 07/22/2020   RPR negative  EEG:  Description: The posterior dominant rhythm consists of 8Hz  activity of moderate voltage (25-35 uV) seen predominantly in posterior head regions, symmetric and reactive to eye opening and eye closing. EEG showed intermittent generalized 3 to 6 Hz theta-delta slowing.  Hyperventilation and photic stimulation were not performed.     MRI brain:  Personally reviewed, agree with radiology No acute intracranial abnormality and largely normal for age noncontrast MRI appearance of the brain.  Thyroid ultrasound:  Right thyroid nodule meets criteria for biopsy, as designated by the newly established ACR TI-RADS criteria, and referral for biopsy is recommended.  CTA Neck: personally reviewed 1. Approximately 40% narrowing of the left vertebral artery origin which is due to mass effect from the adjacent vertebral body (the left vertebral artery originates from the posterior aspect of the left subclavian artery and courses anteromedially between the subclavian artery and the vertebral body). 2. Otherwise, no significant stenosis in the neck. 3. Heterogeneous thyroid with large (4.2 cm) right thyroid nodule. Recommend thyroid (ref: J Am Coll Radiol. 2015 Feb;12(2): 143-50). ABNORMALITY - Intermittent slow, generalized IMPRESSION: This study is suggestive of mild diffuse encephalopathy, nonspecific etiology.  No seizures or epileptiform discharges seen during the study.  Impression: This is a 87 year old woman with a history of dementia, hypertension, prior stroke, presenting with spells of unclear etiology.  Given overall the frequency of the events is low and has been reassuring without clear etiology elucidated, at this point would recommend outpatient follow-up.  Recommendations: - 30 day event monitor on discharge to r/o arrthymias contributing - Thyroid biopsy per primary team - Outpatient neurology follow-up referral placed, consider ambulatory EEG or EMU admission if spells continue  95 MD-PhD [facility] [phone]   Greater than 35 minutes were spent in care of this patient today including discussion with family summarizing the work-up above as well as bedside  examination of the patient, greater than 50% of the time  spent in direct patient contact.  Recommendations conveyed to Dr. Rito Ehrlich via secure chat

## 2020-07-23 NOTE — Progress Notes (Signed)
Patient noted with bleeding after a bowel movement; area assessed. No active bleeding noted.  MD made aware, will continue to monitor.

## 2020-07-23 NOTE — Progress Notes (Signed)
IR received request for thyroid nodule biopsy. Dr. Rito Ehrlich to schedule as an outpatient.  Susan Osborne, [place] [phone] 07/23/2020, 4:19 PM

## 2020-07-23 NOTE — Plan of Care (Signed)

## 2020-07-24 ENCOUNTER — Other Ambulatory Visit: Payer: Self-pay | Admitting: Home Health

## 2020-07-24 ENCOUNTER — Encounter: Payer: Self-pay | Admitting: *Deleted

## 2020-07-24 DIAGNOSIS — I498 Other specified cardiac arrhythmias: Secondary | ICD-10-CM

## 2020-07-24 LAB — BASIC METABOLIC PANEL
Anion gap: 8 (ref 5–15)
BUN: 25 mg/dL — ABNORMAL HIGH (ref 8–23)
CO2: 28 mmol/L (ref 22–32)
Calcium: 9.5 mg/dL (ref 8.9–10.3)
Chloride: 102 mmol/L (ref 98–111)
Creatinine, Ser: 1 mg/dL (ref 0.44–1.00)
GFR, Estimated: 54 mL/min — ABNORMAL LOW (ref >60)
Glucose, Bld: 99 mg/dL (ref 70–99)
Potassium: 4 mmol/L (ref 3.5–5.1)
Sodium: 138 mmol/L (ref 135–145)

## 2020-07-24 LAB — CBC
HCT: 36 % (ref 36.0–46.0)
Hemoglobin: 11.3 g/dL — ABNORMAL LOW (ref 12.0–15.0)
MCH: 30.3 pg (ref 26.0–34.0)
MCHC: 31.4 g/dL (ref 30.0–36.0)
MCV: 96.5 fL (ref 80.0–100.0)
Platelets: 201 10*3/uL (ref 150–400)
RBC: 3.73 MIL/uL — ABNORMAL LOW (ref 3.87–5.11)
RDW: 16.3 % — ABNORMAL HIGH (ref 11.5–15.5)
WBC: 9.8 10*3/uL (ref 4.0–10.5)
nRBC: 0 % (ref 0.0–0.2)

## 2020-07-24 NOTE — Progress Notes (Signed)
30 days event monitor ordered for altered mental status per hospitalist request. New to [facility] heart care, will arrange follow up with DOD Dr Flora Lipps

## 2020-07-24 NOTE — Progress Notes (Signed)
Event monitor mailed to pt.

## 2020-07-24 NOTE — Progress Notes (Signed)
PIV removed and patient belongings gathered. Granddaughter verbalized understanding of DC appointments and medication regimen.  Taken to car via wheelchair for DC.

## 2020-07-24 NOTE — Discharge Summary (Signed)
Discharge Summary  Susan Osborne DJT:[id number] DOB: 1927/09/13  PCP: Theodoro Kos, MD  Admit date: 07/21/2020 Discharge date: 07/24/2020  Time spent: 25 minutes  Recommendations for Outpatient Follow-up:  Referral made to [facility].  Patient will call them for outpatient ambulatory EEG should her spells persist. [facility] radiology will call patient to set up outpatient appointment for thyroid biopsy. [facility] will call patient to set up 30-day event monitor  Discharge Diagnoses:  Active Hospital Problems   Diagnosis Date Noted   AMS (altered mental status) 07/21/2020   Acute metabolic encephalopathy 07/22/2020   Obesity (BMI 30-39.9) 07/22/2020   Hypothyroidism 07/22/2020   Leukocytosis 07/22/2020   Dementia (HCC)    Hyperlipidemia    HTN (hypertension) 07/21/2020    Resolved Hospital Problems  No resolved problems to display.    Discharge Condition: Improved, being discharged home  Diet recommendation: Low-sodium  Vitals:   07/23/20 2128 07/24/20 0621  BP: 132/68 (!) 155/72  Pulse: 79 77  Resp: 19 20  Temp: 98.7 F (37.1 C) 98.1 F (36.7 C)  SpO2: 96% 96%    History of present illness:  87 year old female past medical history of obesity, dementia and hypertension and previous CVA presented to the emergency room on 7/12 with reports of intermittent episodes of difficulty speaking as well as confusion over the past month.  Patient also had an episode on day of arrival to the ED of a headache after a spell where she stopped walking and could not move and felt her whole body getting.  In the emergency room, CT angiogram of head and neck without large vessel occlusion.  White blood cell count noted at 14.5.  Admitted to the hospitalist service.  Neurology consulted.  Hospital Course:  Principal Problem:   AMS (altered mental status)/acute metabolic encephalopathy with chronic underlying dementia: MRI brain unremarkable.  EEG noted no evidence of  seizures, only slow wave generalized findings consistent with her dementia.  B12, TSH normal.  Continue Aricept.  Should patient's spells persist, she will follow-up with [facility] as outpatient to set up ambulatory EEG.  In addition, cardiology has been contacted and will follow up with patient to set up 30-day event monitor  Active Problems:   HTN (hypertension): Blood pressure stable.     Obesity (BMI 30-39.9): Meets criteria BMI greater than 30.     Hyperlipidemia   Leukocytosis: Unclear etiology.  No signs of infection.  Procalcitonin level normal.  Likely stress margination.  White blood cell count normalized by 7/14.   Right thyroid nodule: Noted on CT angiogram of neck.  Thyroid ultrasound recommending biopsy which family would like to have.  TSH normal.  Interventional radiology will contact patient to set up thyroid biopsy which can only be done as outpatient   Consultants: Neurology Case discussed with interventional radiology   Procedures: EEG done on 7/13: No evidence of acute seizures noted.  Generalized slow waves consistent with encephalopathy/dementia  Discharge Exam: BP (!) 155/72 (BP Location: Right Wrist)   Pulse 77   Temp 98.1 F (36.7 C)   Resp 20   Ht 5\' 3"  (1.6 m)   Wt 81.6 kg   SpO2 96%   BMI 31.89 kg/m   General: Alert and oriented x3, no acute distress Cardiovascular: Regular rate and rhythm, S1-S2 Respiratory: Clear to auscultation bilaterally  Discharge Instructions You were cared for by a hospitalist during your hospital stay. If you have any questions about your discharge medications or the care you received  while you were in the hospital after you are discharged, you can call the unit and asked to speak with the hospitalist on call if the hospitalist that took care of you is not available. Once you are discharged, your primary care physician will handle any further medical issues. Please note that NO REFILLS for any discharge medications  will be authorized once you are discharged, as it is imperative that you return to your primary care physician (or establish a relationship with a primary care physician if you do not have one) for your aftercare needs so that they can reassess your need for medications and monitor your lab values.  Discharge Instructions     Diet - low sodium heart healthy   Complete by: As directed    Increase activity slowly   Complete by: As directed       Allergies as of 07/24/2020       Reactions   Codeine         Medication List     TAKE these medications    amLODipine 5 MG tablet Commonly known as: NORVASC Take 5 mg by mouth daily.   atorvastatin 40 MG tablet Commonly known as: LIPITOR Take 40 mg by mouth at bedtime.   busPIRone 10 MG tablet Commonly known as: BUSPAR Take 10 mg by mouth 2 (two) times daily.   Calcium 500 + D 500-125 MG-UNIT Tabs Generic drug: Calcium Carb-Cholecalciferol Take 1 tablet by mouth daily.   donepezil 10 MG tablet Commonly known as: ARICEPT Take 10 mg by mouth at bedtime.   famotidine 40 MG tablet Commonly known as: PEPCID Take 40 mg by mouth daily.   HYDROcodone-acetaminophen 5-325 MG tablet Commonly known as: NORCO/VICODIN Take 1 tablet by mouth in the morning, at noon, and at bedtime.   levothyroxine 25 MCG tablet Commonly known as: SYNTHROID Take 25 mcg by mouth daily before breakfast.   ondansetron 4 MG tablet Commonly known as: ZOFRAN Take 4 mg by mouth every 8 (eight) hours as needed for nausea or vomiting.   venlafaxine XR 150 MG 24 hr capsule Commonly known as: EFFEXOR-XR Take by mouth daily with breakfast.       Allergies  Allergen Reactions   Codeine     Follow-up Information     [facility]. Schedule an appointment as soon as possible for a visit.   Why: Office will call you to set up outpatient EEG (seizure scan) if spells continue. Contact information: [address]     [address] [place] [address] [phone]        [facility] Follow up.   Why: Office will call you to schedule your thyroid biopsy Contact information: [address] [place] [address] [phone]         [facility] Follow up.   Why: Office will call you to set up your 30 day heart monitor looking for abnormal heart rhythms that would cause your spells. Contact information: [address] [place] [address] [phone]                 The results of significant diagnostics from this hospitalization (including imaging, microbiology, ancillary and laboratory) are listed below for reference.    Significant Diagnostic Studies: CT Angio Head W or Wo Contrast  Result Date: 07/21/2020 CLINICAL DATA:  Neuro deficit, acute stroke suspected. EXAM: CT HEAD WITHOUT CONTRAST CT ANGIOGRAPHY OF THE HEAD AND NECK  TECHNIQUE: Contiguous axial images were obtained from the base of the skull through the vertex without intravenous contrast. Multidetector CT imaging of the head and neck was performed using the standard protocol during bolus administration of intravenous contrast. Multiplanar CT image reconstructions and MIPs were obtained to evaluate the vascular anatomy. Carotid stenosis measurements (when applicable) are obtained utilizing NASCET criteria, using the distal internal carotid diameter as the denominator. CONTRAST:  OMNIPAQUE IOHEXOL 350 MG/ML SOLN COMPARISON:  CT head June 18, 2020. FINDINGS: CT HEAD Brain: No evidence of acute large vascular territory infarction, hemorrhage, hydrocephalus, extra-axial collection or mass lesion/mass effect. Patchy white matter hypoattenuation, most likely related to chronic microvascular ischemic disease. Vascular: See below. Skull: No acute fracture. Sinuses/Orbits: Visualized sinuses are clear. No acute orbital findings.  Other: No mastoid effusions. CTA NECK Aortic arch: Calcific atherosclerosis of the aorta and its branch vessels. Great vessel origins are patent. Right carotid system: No significant (greater than 50%) stenosis. Mild atherosclerosis at the carotid bifurcation. Tortuous ICA. Left carotid system: No significant (greater than 50%) stenosis. Mild atherosclerosis at the carotid bifurcation. Tortuous ICA. Vertebral arteries:Co dominant. Approximately 40% narrowing of the left vertebral artery origin which is due to mass effect from the adjacent vertebral body (the left vertebral artery originates from the posterior aspect of the left subclavian artery and courses anteromedially between the subclavian artery and the vertebral body). Mildly dolichoectasia attic vertebrobasilar system. Other neck: Heterogeneous thyroid with large (4.2 cm) right thyroid nodule. Additional smaller thyroid nodules. Visualized lung apices are clear. CTA HEAD Anterior circulation: Bilateral intracranial ICAs, MCAs, and ACAs are patent without proximal flow limiting stenosis. Mild for age bilateral intracranial ICA calcific atherosclerosis. No aneurysm identified. Posterior circulation: Bilateral intradural vertebral arteries, basilar artery, and bilateral posterior cerebral arteries are patent without proximal flow limiting stenosis. Venous sinuses: Visualized dural venous sinuses appear patent. IMPRESSION: CT Head: No evidence of acute intracranial abnormality. CTA Head: 1. No large vessel occlusion or proximal flow limiting stenosis. 2. Mildly dolichoectasia of the vertebrobasilar system. CTA Neck: 1. Approximately 40% narrowing of the left vertebral artery origin which is due to mass effect from the adjacent vertebral body (the left vertebral artery originates from the posterior aspect of the left subclavian artery and courses anteromedially between the subclavian artery and the vertebral body). 2. Otherwise, no significant stenosis in the  neck. 3. Heterogeneous thyroid with large (4.2 cm) right thyroid nodule. Recommend thyroid US (ref: J Am Coll Radiol. 2015 Feb;12(2): 143-50). Electronically Signed   By: Feliberto Harts MD   On: 07/21/2020 16:24   CT Angio Neck W and/or Wo Contrast  Result Date: 07/21/2020 CLINICAL DATA:  Neuro deficit, acute stroke suspected. EXAM: CT HEAD WITHOUT CONTRAST CT ANGIOGRAPHY OF THE HEAD AND NECK TECHNIQUE: Contiguous axial images were obtained from the base of the skull through the vertex without intravenous contrast. Multidetector CT imaging of the head and neck was performed using the standard protocol during bolus administration of intravenous contrast. Multiplanar CT image reconstructions and MIPs were obtained to evaluate the vascular anatomy. Carotid stenosis measurements (when applicable) are obtained utilizing NASCET criteria, using the distal internal carotid diameter as the denominator. CONTRAST:  OMNIPAQUE IOHEXOL 350 MG/ML SOLN COMPARISON:  CT head June 18, 2020. FINDINGS: CT HEAD Brain: No evidence of acute large vascular territory infarction, hemorrhage, hydrocephalus, extra-axial collection or mass lesion/mass effect. Patchy white matter hypoattenuation, most likely related to chronic microvascular ischemic disease. Vascular: See below. Skull: No acute fracture. Sinuses/Orbits: Visualized sinuses are  clear. No acute orbital findings. Other: No mastoid effusions. CTA NECK Aortic arch: Calcific atherosclerosis of the aorta and its branch vessels. Great vessel origins are patent. Right carotid system: No significant (greater than 50%) stenosis. Mild atherosclerosis at the carotid bifurcation. Tortuous ICA. Left carotid system: No significant (greater than 50%) stenosis. Mild atherosclerosis at the carotid bifurcation. Tortuous ICA. Vertebral arteries:Co dominant. Approximately 40% narrowing of the left vertebral artery origin which is due to mass effect from the adjacent vertebral body (the left  vertebral artery originates from the posterior aspect of the left subclavian artery and courses anteromedially between the subclavian artery and the vertebral body). Mildly dolichoectasia attic vertebrobasilar system. Other neck: Heterogeneous thyroid with large (4.2 cm) right thyroid nodule. Additional smaller thyroid nodules. Visualized lung apices are clear. CTA HEAD Anterior circulation: Bilateral intracranial ICAs, MCAs, and ACAs are patent without proximal flow limiting stenosis. Mild for age bilateral intracranial ICA calcific atherosclerosis. No aneurysm identified. Posterior circulation: Bilateral intradural vertebral arteries, basilar artery, and bilateral posterior cerebral arteries are patent without proximal flow limiting stenosis. Venous sinuses: Visualized dural venous sinuses appear patent. IMPRESSION: CT Head: No evidence of acute intracranial abnormality. CTA Head: 1. No large vessel occlusion or proximal flow limiting stenosis. 2. Mildly dolichoectasia of the vertebrobasilar system. CTA Neck: 1. Approximately 40% narrowing of the left vertebral artery origin which is due to mass effect from the adjacent vertebral body (the left vertebral artery originates from the posterior aspect of the left subclavian artery and courses anteromedially between the subclavian artery and the vertebral body). 2. Otherwise, no significant stenosis in the neck. 3. Heterogeneous thyroid with large (4.2 cm) right thyroid nodule. Recommend thyroid US (ref: J Am Coll Radiol. 2015 Feb;12(2): 143-50). Electronically Signed   By: Feliberto Harts MD   On: 07/21/2020 16:24   MR BRAIN WO CONTRAST  Result Date: 07/22/2020 CLINICAL DATA:  87 year old female with episodes of altered mental status, confusion, loss of speech and headache. EXAM: MRI HEAD WITHOUT CONTRAST TECHNIQUE: Multiplanar, multiecho pulse sequences of the brain and surrounding structures were obtained without intravenous contrast. COMPARISON:  CTA head and  neck 07/21/2020, head CT 06/18/2020. FINDINGS: Brain: No restricted diffusion to suggest acute infarction. No midline shift, mass effect, evidence of mass lesion, ventriculomegaly, extra-axial collection or acute intracranial hemorrhage. Cervicomedullary junction and pituitary are within normal limits. Cerebral volume is within normal limits for age. Largely normal for age gray and white matter signal throughout the brain. There is mild nonspecific periventricular white matter and central pontine T2 and FLAIR hyperintensity. No cortical encephalomalacia or chronic cerebral blood products identified. Mild T2 heterogeneity in the deep gray nuclei appears mostly due to perivascular spaces. Negative cerebellum. Vascular: Major intracranial vascular flow voids are preserved with a moderate degree of generalized intracranial artery ectasia. Skull and upper cervical spine: Visible cervical spine is negative for age. Visualized bone marrow signal is within normal limits. Sinuses/Orbits: Postoperative changes to both globes, otherwise negative. Paranasal Visualized paranasal sinuses and mastoids are stable and well aerated. Other: Visible internal auditory structures appear grossly normal. Negative visible scalp and face. IMPRESSION: No acute intracranial abnormality and largely normal for age noncontrast MRI appearance of the brain. Electronically Signed   By: Odessa Fleming M.D.   On: 07/22/2020 05:04   CT ABDOMEN PELVIS W CONTRAST  Result Date: 07/21/2020 CLINICAL DATA:  Nausea and vomiting EXAM: CT ABDOMEN AND PELVIS WITH CONTRAST TECHNIQUE: Multidetector CT imaging of the abdomen and pelvis was performed using the standard protocol following  bolus administration of intravenous contrast. CONTRAST:  OMNIPAQUE IOHEXOL 350 MG/ML SOLN COMPARISON:  None. FINDINGS: Lower chest: Included lung bases are clear. Heart size is within normal limits. Coronary artery calcification. Hepatobiliary: No focal liver abnormality is seen.  No gallstones, gallbladder wall thickening, or biliary dilatation. Pancreas: Unremarkable. No pancreatic ductal dilatation or surrounding inflammatory changes. Spleen: Normal in size without focal abnormality. Adrenals/Urinary Tract: Unremarkable adrenal glands. Bilateral renal cortical atrophy with multiple small rounded low-attenuation lesions, likely cysts although some are too small to definitively characterize. No renal stone or hydronephrosis. Urinary bladder within normal limits. Stomach/Bowel: Stomach is within normal limits. Moderate-sized duodenal diverticulum measuring approximately 4.5 cm. The appendix is not visualized and may be surgically absent. Extensive colonic diverticulosis. No evidence of bowel wall thickening, distention, or inflammatory changes. Vascular/Lymphatic: Extensive aortoiliac atherosclerosis without aneurysm. No abdominopelvic lymphadenopathy. Reproductive: Anteverted uterus. Mildly prominent endometrial stripe. No adnexal masses. Other: No free fluid. No abdominopelvic fluid collection. No pneumoperitoneum. Small supraumbilical abdominal wall hernia with a small portion of transverse colon protruding into the hernia sac. Musculoskeletal: Prior right hip ORIF. Multilevel degenerative changes of the lumbar spine. No acute osseous findings. No suspicious bony lesion. IMPRESSION: 1. No acute abdominopelvic findings. 2. Extensive colonic diverticulosis without evidence of acute diverticulitis. 3. Small supraumbilical abdominal wall hernia with a small portion of transverse colon protruding into the hernia sac. Negative for bowel obstruction. 4. Mildly prominent endometrial stripe. Further evaluation with pelvic ultrasound can be considered on a clinical basis given patient's age and comorbidities. 5. Aortic atherosclerosis (ICD10-I70.0). Electronically Signed   By: Duanne Guess D.O.   On: 07/21/2020 16:44   DG Knee Complete 4 Views Right  Result Date: 07/21/2020 CLINICAL DATA:   Knee injury EXAM: RIGHT KNEE - COMPLETE 4+ VIEW COMPARISON:  None. FINDINGS: Prior right knee replacement. No hardware complicating feature. No acute bony abnormality. Specifically, no fracture, subluxation, or dislocation. No joint effusion. IMPRESSION: Prior right knee replacement.  No acute bony abnormality. Electronically Signed   By: Charlett Nose M.D.   On: 07/21/2020 14:12   EEG adult  Result Date: 07/22/2020 Charlsie Quest, MD     07/22/2020  1:26 PM Patient Name: Susan Osborne MRN: [id number] Epilepsy Attending: Charlsie Quest Referring Physician/Provider: Dr Kennis Carina Date: 07/22/2020 Duration: 23.09 mins Patient history:  87 year old female presenting with a one month history of recurrent spells of motor freezing and word finding difficulty.  EEG to evaluate for seizures. Level of alertness: Awake AEDs during EEG study: None Technical aspects: This EEG study was done with scalp electrodes positioned according to the 10-20 International system of electrode placement. Electrical activity was acquired at a sampling rate of  and reviewed with a high frequency filter of  and a low frequency filter of . EEG data were recorded continuously and digitally stored. Description: The posterior dominant rhythm consists of  activity of moderate voltage (25-35 uV) seen predominantly in posterior head regions, symmetric and reactive to eye opening and eye closing. EEG showed intermittent generalized 3 to 6 Hz theta-delta slowing. Hyperventilation and photic stimulation were not performed.   ABNORMALITY - Intermittent slow, generalized IMPRESSION: This study is suggestive of mild diffuse encephalopathy, nonspecific etiology.  No seizures or epileptiform discharges seen during the study. Charlsie Quest   US THYROID  Result Date: 07/22/2020 CLINICAL DATA:  87 year old female with a history right sided thyroid EXAM: THYROID ULTRASOUND TECHNIQUE: Ultrasound examination of the thyroid gland and  adjacent soft tissues was performed.  COMPARISON:  CT 07/21/2020 FINDINGS: Parenchymal Echotexture: Mildly heterogenous Isthmus: 0.8 cm Right lobe: 5.8 cm x 4.0 cm x 1.6 cm Left lobe: 3.6 cm x 1.4 cm x 1.2 cm _________________________________________________________ Estimated total number of nodules >/= 1 cm: 1 Number of spongiform nodules >/=  2 cm not described below (TR1): 0 Number of mixed cystic and solid nodules >/= 1.5 cm not described below (TR2): 0 _________________________________________________________ Nodule # 1: Location: Right; Mid Maximum size: 4.4 cm; Other 2 dimensions: 4.1 cm x 3.4 cm Composition: solid/almost completely solid (2) Echogenicity: isoechoic (1) Shape: not taller-than-wide (0) Margins: ill-defined (0) Echogenic foci: none (0) ACR TI-RADS total points: 3. ACR TI-RADS risk category: TR3 (3 points). ACR TI-RADS recommendations: Nodule meets criteria for biopsy _________________________________________________________ No adenopathy IMPRESSION: Right thyroid nodule meets criteria for biopsy, as designated by the newly established ACR TI-RADS criteria, and referral for biopsy is recommended. Recommendations follow those established by the new ACR TI-RADS criteria (J Am Coll Radiol 2017;14:587-595). Electronically Signed   By: Gilmer Mor D.O.   On: 07/22/2020 15:59    Microbiology: Recent Results (from the past 240 hour(s))  Resp Panel by RT-PCR (Flu A&B, Covid) Nasopharyngeal Swab     Status: None   Collection Time: 07/21/20  3:40 PM   Specimen: Nasopharyngeal Swab; Nasopharyngeal(NP) swabs in vial transport medium  Result Value Ref Range Status   SARS Coronavirus 2 by RT PCR NEGATIVE NEGATIVE Final    Comment: (NOTE) SARS-CoV-2 target nucleic acids are NOT DETECTED.  The SARS-CoV-2 RNA is generally detectable in upper respiratory specimens during the acute phase of infection. The lowest concentration of SARS-CoV-2 viral copies this assay can detect is 138 copies/mL. A  negative result does not preclude SARS-Cov-2 infection and should not be used as the sole basis for treatment or other patient management decisions. A negative result may occur with  improper specimen collection/handling, submission of specimen other than nasopharyngeal swab, presence of viral mutation(s) within the areas targeted by this assay, and inadequate number of viral copies(<138 copies/mL). A negative result must be combined with clinical observations, patient history, and epidemiological information. The expected result is Negative.  Fact Sheet for Patients:  BloggerCourse.com  Fact Sheet for Healthcare Providers:  SeriousBroker.it  This test is no t yet approved or cleared by the [place] FDA and  has been authorized for detection and/or diagnosis of SARS-CoV-2 by FDA under an Emergency Use Authorization (EUA). This EUA will remain  in effect (meaning this test can be used) for the duration of the COVID-19 declaration under Section 564(b)(1) of the Act, 21 U.S.C.section 360bbb-3(b)(1), unless the authorization is terminated  or revoked sooner.       Influenza A by PCR NEGATIVE NEGATIVE Final   Influenza B by PCR NEGATIVE NEGATIVE Final    Comment: (NOTE) The Xpert Xpress SARS-CoV-2/FLU/RSV plus assay is intended as an aid in the diagnosis of influenza from Nasopharyngeal swab specimens and should not be used as a sole basis for treatment. Nasal washings and aspirates are unacceptable for Xpert Xpress SARS-CoV-2/FLU/RSV testing.  Fact Sheet for Patients: BloggerCourse.com  Fact Sheet for Healthcare Providers: SeriousBroker.it  This test is not yet approved or cleared by the [place] FDA and has been authorized for detection and/or diagnosis of SARS-CoV-2 by FDA under an Emergency Use Authorization (EUA). This EUA will remain in effect (meaning this test can  be used) for the duration of the COVID-19 declaration under Section 564(b)(1) of the Act, 21 U.S.C. section 360bbb-3(b)(1), unless  the authorization is terminated or revoked.  Performed at [facility], [address] [place], [place] [id number]      Labs: Basic Metabolic Panel: Recent Labs  Lab 07/21/20 1338 07/22/20 0135 07/24/20 0446  NA 136 138 138  K 3.4* 3.6 4.0  CL 103 102 102  CO2 25 28 28   GLUCOSE 151* 120* 99  BUN 23 18 25*  CREATININE 0.95 0.99 1.00  CALCIUM 9.2 9.2 9.5  MG 1.8  --   --    Liver Function Tests: Recent Labs  Lab 07/21/20 1338  AST 16  ALT 13  ALKPHOS 119  BILITOT 0.4  PROT 7.7  ALBUMIN 4.1   Recent Labs  Lab 07/21/20 1338  LIPASE 45   No results for input(s): AMMONIA in the last 168 hours. CBC: Recent Labs  Lab 07/21/20 1338 07/22/20 0135 07/23/20 0154 07/24/20 0446  WBC 14.5* 15.5* 9.8 9.8  NEUTROABS 8.9*  --  5.6  --   HGB 13.1 11.9* 11.4* 11.3*  HCT 41.3 37.2 36.1 36.0  MCV 97.2 95.1 96.0 96.5  PLT 241 208 198 201   Cardiac Enzymes: No results for input(s): CKTOTAL, CKMB, CKMBINDEX, TROPONINI in the last 168 hours. BNP: BNP (last 3 results) No results for input(s): BNP in the last 8760 hours.  ProBNP (last 3 results) No results for input(s): PROBNP in the last 8760 hours.  CBG: No results for input(s): GLUCAP in the last 168 hours.     Signed:  Hollice EspySendil K Adilee Lemme, MD [facility] 07/24/2020, 8:57 AM

## 2020-07-24 NOTE — Care Management Important Message (Signed)
Important Message  Patient Details  Name: Susan Osborne MRN: [id number] Date of Birth: December 07, 1927   Medicare Important Message Given:  Yes     Mally Stefan Church 07/24/2020, 2:34 PM

## 2020-07-27 ENCOUNTER — Other Ambulatory Visit: Payer: Self-pay | Admitting: Home Health

## 2020-07-27 DIAGNOSIS — R9431 Abnormal electrocardiogram [ECG] [EKG]: Secondary | ICD-10-CM

## 2020-07-27 DIAGNOSIS — I498 Other specified cardiac arrhythmias: Secondary | ICD-10-CM

## 2020-07-27 DIAGNOSIS — G459 Transient cerebral ischemic attack, unspecified: Secondary | ICD-10-CM

## 2020-07-27 DIAGNOSIS — R4182 Altered mental status, unspecified: Secondary | ICD-10-CM

## 2020-10-05 ENCOUNTER — Ambulatory Visit: Payer: Medicare Other | Admitting: Cardiovascular Disease

## 2021-07-24 ENCOUNTER — Other Ambulatory Visit: Payer: Self-pay | Admitting: Internal Medicine

## 2021-07-24 DIAGNOSIS — E041 Nontoxic single thyroid nodule: Secondary | ICD-10-CM

## 2022-10-03 ENCOUNTER — Inpatient Hospital Stay (HOSPITAL_COMMUNITY): Payer: Medicare Other

## 2022-10-03 ENCOUNTER — Encounter (HOSPITAL_COMMUNITY): Payer: Self-pay | Admitting: *Deleted

## 2022-10-03 ENCOUNTER — Emergency Department (HOSPITAL_COMMUNITY): Payer: Medicare Other

## 2022-10-03 ENCOUNTER — Other Ambulatory Visit: Payer: Self-pay

## 2022-10-03 ENCOUNTER — Inpatient Hospital Stay (HOSPITAL_COMMUNITY)
Admission: EM | Admit: 2022-10-03 | Discharge: 2022-10-10 | DRG: 956 | Disposition: A | Discharge status: Skilled Nursing Facility | Payer: Medicare Other | Attending: Family Medicine | Admitting: Family Medicine

## 2022-10-03 DIAGNOSIS — S72401A Unspecified fracture of lower end of right femur, initial encounter for closed fracture: Principal | ICD-10-CM | POA: Diagnosis present

## 2022-10-03 DIAGNOSIS — D62 Acute posthemorrhagic anemia: Secondary | ICD-10-CM | POA: Diagnosis not present

## 2022-10-03 DIAGNOSIS — B9620 Unspecified Escherichia coli [E. coli] as the cause of diseases classified elsewhere: Secondary | ICD-10-CM | POA: Diagnosis present

## 2022-10-03 DIAGNOSIS — Z96651 Presence of right artificial knee joint: Secondary | ICD-10-CM | POA: Diagnosis present

## 2022-10-03 DIAGNOSIS — E041 Nontoxic single thyroid nodule: Secondary | ICD-10-CM | POA: Diagnosis present

## 2022-10-03 DIAGNOSIS — Z79891 Long term (current) use of opiate analgesic: Secondary | ICD-10-CM | POA: Diagnosis not present

## 2022-10-03 DIAGNOSIS — N3000 Acute cystitis without hematuria: Secondary | ICD-10-CM | POA: Diagnosis not present

## 2022-10-03 DIAGNOSIS — Z87440 Personal history of urinary (tract) infections: Secondary | ICD-10-CM | POA: Diagnosis not present

## 2022-10-03 DIAGNOSIS — S52614A Nondisplaced fracture of right ulna styloid process, initial encounter for closed fracture: Secondary | ICD-10-CM | POA: Diagnosis not present

## 2022-10-03 DIAGNOSIS — S52611A Displaced fracture of right ulna styloid process, initial encounter for closed fracture: Secondary | ICD-10-CM | POA: Diagnosis present

## 2022-10-03 DIAGNOSIS — N390 Urinary tract infection, site not specified: Secondary | ICD-10-CM

## 2022-10-03 DIAGNOSIS — Z885 Allergy status to narcotic agent status: Secondary | ICD-10-CM | POA: Diagnosis not present

## 2022-10-03 DIAGNOSIS — T796XXA Traumatic ischemia of muscle, initial encounter: Secondary | ICD-10-CM | POA: Diagnosis present

## 2022-10-03 DIAGNOSIS — Z8673 Personal history of transient ischemic attack (TIA), and cerebral infarction without residual deficits: Secondary | ICD-10-CM

## 2022-10-03 DIAGNOSIS — W19XXXA Unspecified fall, initial encounter: Secondary | ICD-10-CM | POA: Diagnosis not present

## 2022-10-03 DIAGNOSIS — E871 Hypo-osmolality and hyponatremia: Secondary | ICD-10-CM | POA: Diagnosis present

## 2022-10-03 DIAGNOSIS — Z90710 Acquired absence of both cervix and uterus: Secondary | ICD-10-CM

## 2022-10-03 DIAGNOSIS — S72351A Displaced comminuted fracture of shaft of right femur, initial encounter for closed fracture: Secondary | ICD-10-CM | POA: Diagnosis not present

## 2022-10-03 DIAGNOSIS — S7291XA Unspecified fracture of right femur, initial encounter for closed fracture: Secondary | ICD-10-CM

## 2022-10-03 DIAGNOSIS — Y92000 Kitchen of unspecified non-institutional (private) residence as  the place of occurrence of the external cause: Secondary | ICD-10-CM | POA: Diagnosis not present

## 2022-10-03 DIAGNOSIS — I10 Essential (primary) hypertension: Secondary | ICD-10-CM | POA: Diagnosis present

## 2022-10-03 DIAGNOSIS — Z79899 Other long term (current) drug therapy: Secondary | ICD-10-CM | POA: Diagnosis not present

## 2022-10-03 DIAGNOSIS — E860 Dehydration: Secondary | ICD-10-CM | POA: Diagnosis present

## 2022-10-03 DIAGNOSIS — E039 Hypothyroidism, unspecified: Secondary | ICD-10-CM | POA: Diagnosis present

## 2022-10-03 DIAGNOSIS — Z87891 Personal history of nicotine dependence: Secondary | ICD-10-CM | POA: Diagnosis not present

## 2022-10-03 DIAGNOSIS — M6282 Rhabdomyolysis: Secondary | ICD-10-CM | POA: Insufficient documentation

## 2022-10-03 DIAGNOSIS — S72144A Nondisplaced intertrochanteric fracture of right femur, initial encounter for closed fracture: Secondary | ICD-10-CM | POA: Diagnosis not present

## 2022-10-03 DIAGNOSIS — S72041A Displaced fracture of base of neck of right femur, initial encounter for closed fracture: Secondary | ICD-10-CM | POA: Diagnosis not present

## 2022-10-03 DIAGNOSIS — E785 Hyperlipidemia, unspecified: Secondary | ICD-10-CM | POA: Diagnosis present

## 2022-10-03 DIAGNOSIS — S72309A Unspecified fracture of shaft of unspecified femur, initial encounter for closed fracture: Secondary | ICD-10-CM

## 2022-10-03 DIAGNOSIS — S52511A Displaced fracture of right radial styloid process, initial encounter for closed fracture: Secondary | ICD-10-CM | POA: Diagnosis not present

## 2022-10-03 DIAGNOSIS — S52551A Other extraarticular fracture of lower end of right radius, initial encounter for closed fracture: Secondary | ICD-10-CM | POA: Diagnosis present

## 2022-10-03 DIAGNOSIS — N3001 Acute cystitis with hematuria: Secondary | ICD-10-CM | POA: Diagnosis present

## 2022-10-03 DIAGNOSIS — W1830XA Fall on same level, unspecified, initial encounter: Secondary | ICD-10-CM | POA: Diagnosis present

## 2022-10-03 DIAGNOSIS — N179 Acute kidney failure, unspecified: Secondary | ICD-10-CM | POA: Diagnosis present

## 2022-10-03 DIAGNOSIS — M9711XA Periprosthetic fracture around internal prosthetic right knee joint, initial encounter: Secondary | ICD-10-CM | POA: Diagnosis present

## 2022-10-03 DIAGNOSIS — S52501A Unspecified fracture of the lower end of right radius, initial encounter for closed fracture: Secondary | ICD-10-CM | POA: Diagnosis not present

## 2022-10-03 DIAGNOSIS — Z751 Person awaiting admission to adequate facility elsewhere: Secondary | ICD-10-CM

## 2022-10-03 DIAGNOSIS — F0390 Unspecified dementia without behavioral disturbance: Secondary | ICD-10-CM | POA: Diagnosis present

## 2022-10-03 DIAGNOSIS — S5291XA Unspecified fracture of right forearm, initial encounter for closed fracture: Secondary | ICD-10-CM

## 2022-10-03 DIAGNOSIS — Z79890 Hormone replacement therapy (postmenopausal): Secondary | ICD-10-CM

## 2022-10-03 DIAGNOSIS — S72361A Displaced segmental fracture of shaft of right femur, initial encounter for closed fracture: Secondary | ICD-10-CM | POA: Diagnosis not present

## 2022-10-03 DIAGNOSIS — S72141S Displaced intertrochanteric fracture of right femur, sequela: Secondary | ICD-10-CM | POA: Diagnosis not present

## 2022-10-03 DIAGNOSIS — I159 Secondary hypertension, unspecified: Secondary | ICD-10-CM

## 2022-10-03 LAB — URINALYSIS, ROUTINE W REFLEX MICROSCOPIC
Bilirubin Urine: NEGATIVE
Glucose, UA: NEGATIVE mg/dL
Ketones, ur: 5 mg/dL — AB
Nitrite: NEGATIVE
Protein, ur: 100 mg/dL — AB
Specific Gravity, Urine: 1.017 (ref 1.005–1.030)
pH: 5 (ref 5.0–8.0)

## 2022-10-03 LAB — CBC
HCT: 29.2 % — ABNORMAL LOW (ref 36.0–46.0)
Hemoglobin: 9.3 g/dL — ABNORMAL LOW (ref 12.0–15.0)
MCH: 30.2 pg (ref 26.0–34.0)
MCHC: 31.8 g/dL (ref 30.0–36.0)
MCV: 94.8 fL (ref 80.0–100.0)
Platelets: 178 10*3/uL (ref 150–400)
RBC: 3.08 MIL/uL — ABNORMAL LOW (ref 3.87–5.11)
RDW: 18.9 % — ABNORMAL HIGH (ref 11.5–15.5)
WBC: 26.5 10*3/uL — ABNORMAL HIGH (ref 4.0–10.5)
nRBC: 0 % (ref 0.0–0.2)

## 2022-10-03 LAB — COMPREHENSIVE METABOLIC PANEL
ALT: 25 U/L (ref 0–44)
AST: 74 U/L — ABNORMAL HIGH (ref 15–41)
Albumin: 3.5 g/dL (ref 3.5–5.0)
Alkaline Phosphatase: 83 U/L (ref 38–126)
Anion gap: 11 (ref 5–15)
BUN: 46 mg/dL — ABNORMAL HIGH (ref 8–23)
CO2: 21 mmol/L — ABNORMAL LOW (ref 22–32)
Calcium: 8.8 mg/dL — ABNORMAL LOW (ref 8.9–10.3)
Chloride: 102 mmol/L (ref 98–111)
Creatinine, Ser: 1.83 mg/dL — ABNORMAL HIGH (ref 0.44–1.00)
GFR, Estimated: 25 mL/min — ABNORMAL LOW (ref >60)
Glucose, Bld: 169 mg/dL — ABNORMAL HIGH (ref 70–99)
Potassium: 3.9 mmol/L (ref 3.5–5.1)
Sodium: 134 mmol/L — ABNORMAL LOW (ref 135–145)
Total Bilirubin: 0.7 mg/dL (ref 0.3–1.2)
Total Protein: 6.9 g/dL (ref 6.5–8.1)

## 2022-10-03 LAB — CK: Total CK: 1991 U/L — ABNORMAL HIGH (ref 38–234)

## 2022-10-03 MED ORDER — FENTANYL CITRATE PF 50 MCG/ML IJ SOSY
50.0000 ug | PREFILLED_SYRINGE | Freq: Once | INTRAMUSCULAR | Status: AC
  Administered 2022-10-03: 50 ug via INTRAVENOUS
  Filled 2022-10-03: qty 1

## 2022-10-03 MED ORDER — LIDOCAINE HCL (PF) 2 % IJ SOLN
INTRAMUSCULAR | Status: AC
  Administered 2022-10-03: 10 mL
  Filled 2022-10-03: qty 10

## 2022-10-03 MED ORDER — SODIUM CHLORIDE 0.9 % IV SOLN
1.0000 g | INTRAVENOUS | Status: DC
Start: 2022-10-03 — End: 2022-10-03

## 2022-10-03 MED ORDER — SODIUM CHLORIDE 0.9 % IV SOLN
2.0000 g | Freq: Once | INTRAVENOUS | Status: AC
  Administered 2022-10-03: 2 g via INTRAVENOUS
  Filled 2022-10-03: qty 20

## 2022-10-03 MED ORDER — MORPHINE SULFATE (PF) 2 MG/ML IV SOLN
2.0000 mg | INTRAVENOUS | Status: DC | PRN
  Administered 2022-10-03 – 2022-10-09 (×9): 2 mg via INTRAVENOUS
  Filled 2022-10-03 (×9): qty 1

## 2022-10-03 MED ORDER — SODIUM CHLORIDE 0.9 % IV BOLUS
1000.0000 mL | Freq: Once | INTRAVENOUS | Status: AC
  Administered 2022-10-03: 1000 mL via INTRAVENOUS

## 2022-10-03 MED ORDER — LIDOCAINE HCL (PF) 2 % IJ SOLN: 10.0000 mL | Freq: Once | INTRAMUSCULAR | Status: AC

## 2022-10-03 MED ORDER — FENTANYL CITRATE PF 50 MCG/ML IJ SOSY
25.0000 ug | PREFILLED_SYRINGE | Freq: Once | INTRAMUSCULAR | Status: AC
  Administered 2022-10-03: 25 ug via INTRAVENOUS
  Filled 2022-10-03: qty 1

## 2022-10-03 MED ORDER — SODIUM CHLORIDE 0.9 % IV SOLN
1.0000 g | INTRAVENOUS | Status: DC
  Administered 2022-10-04 – 2022-10-07 (×3): 1 g via INTRAVENOUS
  Filled 2022-10-03 (×3): qty 10

## 2022-10-03 NOTE — ED Notes (Signed)
Attempted to call report to [facility]

## 2022-10-03 NOTE — Assessment & Plan Note (Signed)
-   ER reduction and splinting of wrist - Ortho consulted - Recommending admission to [facility].  Reportedly our Ortho spoke with the Ortho at [facility] ([place] -Pain control - Continue to monitor

## 2022-10-03 NOTE — ED Notes (Signed)
Patient transported to X-ray 

## 2022-10-03 NOTE — ED Notes (Signed)
Patient transported to CT 

## 2022-10-03 NOTE — Assessment & Plan Note (Signed)
Continue with levothyroxine.  Follow up thyroid nodule as outpatient.

## 2022-10-03 NOTE — ED Notes (Signed)
Ice pack applied to rt arm.

## 2022-10-03 NOTE — Assessment & Plan Note (Signed)
Right wrist fracture. Continue immobilization and pain control. Follow further recommendations from orthopedics.

## 2022-10-03 NOTE — Assessment & Plan Note (Signed)
-  Continue Lipitor

## 2022-10-03 NOTE — Assessment & Plan Note (Signed)
Urinary tract infection present on admission with no sepsis.  Plan to continue antibiotic therapy with ceftriaxone and follow up urine cultures.  Wbc is 22,5 with 18,5 PMN.

## 2022-10-03 NOTE — Assessment & Plan Note (Signed)
-   Unknown mechanism of fall - Found down for more than 12 hours but unknown exact time of fall -

## 2022-10-03 NOTE — H&P (Signed)
History and Physical    PatientArnice Osborne ZYS:[id number] DOB: 26-Aug-1927 DOA: 10/03/2022 DOS: the patient was seen and examined on 10/03/2022 PCP: Theodoro Kos, MD  Patient coming from: Home  Chief Complaint:  Chief Complaint  Patient presents with   Fall   HPI: Susan Osborne is a 89 y.o. female with medical history significant of arthritis, dementia, hypertension, hypothyroidism, recurrent UTI, hyperlipidemia, and more presents the ED with a chief complaint of fall.  Patient fell yesterday at an unknown time during the evening.  Family member had been with her into the early evening.  Then she cannot be reached after 7 PM.  She was found this morning asleep on her right side on the floor in the kitchen.  Due to her dementia she is not able to give any history.  She lives alone, but a family member is usually with her during the day.  She is alone at night.  When she was found she woke up pretty easily.  She has been confused more than normal since then.  She was last seen the day of the fall during the day.  At that time she had no complaints.  This saw PCP about 5 days ago, and requested a UA and culture at that time.  Her urine was dark but she had no other symptoms.  PCP called him today and said that she did have a UTI.  She has been known to get confused when she has a UTI.  Her last fall was 6 or 8 months ago.  She ambulates with a rollator.  No further history could be obtained at this time.  Patient does not drink and does not smoke.  At bedside self-reported POA says that patient has told her she would want to be on life support if it was short-term but not if it was long-term.  Interpreting that as full code. Review of Systems: unable to review all systems due to the inability of the patient to answer questions. Past Medical History:  Diagnosis Date   Arthritis    Dementia (HCC)    Hypertension    Stroke [facility])    Past Surgical History:  Procedure Laterality Date   ABDOMINAL  HYSTERECTOMY     FOOT SURGERY Right    HIP SURGERY Right    REPLACEMENT TOTAL KNEE Right    Social History:  reports that she has quit smoking. Her smoking use included cigarettes. She has never been exposed to tobacco smoke. She has never used smokeless tobacco. No history on file for alcohol use and drug use.  Allergies  Allergen Reactions   Codeine     History reviewed. No pertinent family history.  Prior to Admission medications   Medication Sig Start Date End Date Taking? Authorizing Provider  amLODipine (NORVASC) 5 MG tablet Take 5 mg by mouth daily. 07/02/19   [provider]  atorvastatin (LIPITOR) 40 MG tablet Take 40 mg by mouth at bedtime. 04/03/19   [provider]  busPIRone (BUSPAR) 10 MG tablet Take 10 mg by mouth 2 (two) times daily. 04/08/20   [provider]  Calcium Carb-Cholecalciferol (CALCIUM 500 + D) 500-125 MG-UNIT TABS Take 1 tablet by mouth daily.    [provider]  donepezil (ARICEPT) 10 MG tablet Take 10 mg by mouth at bedtime. 09/30/19   [provider]  famotidine (PEPCID) 40 MG tablet Take 40 mg by mouth daily. 03/25/20   [provider]  HYDROcodone-acetaminophen (NORCO/VICODIN) 5-325 MG tablet  Take 1 tablet by mouth in the morning, at noon, and at bedtime. 07/10/20   [provider]  levothyroxine (SYNTHROID) 25 MCG tablet Take 25 mcg by mouth daily before breakfast. 03/30/20   [provider]  ondansetron (ZOFRAN) 4 MG tablet Take 4 mg by mouth every 8 (eight) hours as needed for nausea or vomiting. 03/25/20   [provider]  venlafaxine XR (EFFEXOR-XR) 150 MG 24 hr capsule Take by mouth daily with breakfast. 04/08/20   [provider]  citalopram (CELEXA) 10 MG tablet citalopram 10 mg tablet Patient not taking: No sig reported  07/24/20  [provider]  DULoxetine (CYMBALTA) 30 MG capsule Take 30 mg by mouth at bedtime. Patient not taking: No sig reported 12/12/19  07/24/20  [provider]  enoxaparin (LOVENOX) 30 MG/0.3ML injection SMARTSIG:0.3 Milliliter(s) SUB-Q Daily Patient not taking: No sig reported 10/29/19 07/24/20  [provider]  furosemide (LASIX) 20 MG tablet furosemide 20 mg tablet  TK 1 T PO QAM Patient not taking: No sig reported  07/24/20  [provider]  omeprazole (PRILOSEC) 40 MG capsule omeprazole 40 mg capsule,delayed release Patient not taking: No sig reported 04/03/19 07/24/20  [provider]  potassium chloride (KLOR-CON) 10 MEQ tablet Take 10 mEq by mouth daily. Patient not taking: No sig reported 12/30/19 07/24/20  [provider]    Physical Exam: Vitals:   10/03/22 1237 10/03/22 1247 10/03/22 1923 10/03/22 2000  BP: 133/70  125/67 (!) 109/50  Pulse: 87  86 90  Resp: 18  18 18   Temp: 98.1 F (36.7 C)  98.4 F (36.9 C)   TempSrc: Oral  Oral   SpO2: 98%  100% 94%  Weight:  81.6 kg    Height:  5\' 3"  (1.6 m)     1.  General: Patient lying supine in bed,  no acute distress   2. Psychiatric: Alert and oriented x person and place, recognizes family member at bedside, mood and behavior normal for situation, pleasant and cooperative with exam   3. Neurologic: Speech and language are normal, face is symmetric, moves all 4 extremities voluntarily, at baseline without acute deficits on limited exam   4. HEENMT:  Head is atraumatic, normocephalic, pupils reactive to light, neck is supple, trachea is midline, mucous membranes are moist   5. Respiratory : Lungs are clear to auscultation bilaterally without wheezing, rhonchi, rales, no cyanosis, no increase in work of breathing or accessory muscle use   6. Cardiovascular : Heart rate normal, rhythm is regular, murmur present but no rubs or gallops, no peripheral edema, peripheral pulses palpated   7. Gastrointestinal:  Abdomen is soft, nondistended, nontender to palpation bowel sounds active, no masses or organomegaly palpated    8. Skin:  Skin is warm, dry and intact without rashes, acute lesions, or ulcers on limited exam   9.Musculoskeletal:  Bruising on back of right leg in the popliteal fossa and thigh, bruising on left leg mid shin with small abrasion  Data Reviewed: In the ED temp 98.1, heart rate 87, respiratory 18, blood pressure 133/70, satting at 98% Leukocytosis at 26.5 AKI with a creatinine of 1.83 Mild rhabdo with a CPK of nearly 2000 UA indicative of UTI CT head shows no acute intracranial abnormality CT C-spine shows no acute fracture or traumatic malalignment but it does show a large thyroid nodule that is likely known EKG is a heart rate 84, sinus rhythm, QTc 505 Patient was given Rocephin, fentanyl, 1 L normal  saline Admission requested for dementia femur fracture that will require an orthopedic surgeon at [facility]  Assessment and Plan: * Fall - Unknown mechanism of fall - Found down for more than 12 hours but unknown exact time of fall -  UTI (urinary tract infection) - Leukocytosis 26.5, UA indicative of UTI, urine culture pending - Rocephin started in the ED - Continue Rocephin  AKI (acute kidney injury) (HCC) - Baseline 1.00 - Today 1.83 - Likely related to dehydration and rhabdomyolysis - Continue IV hydration - Trend in the a.m.  Rhabdomyolysis - CPK nearly 2000 - 1 L fluid given in the ED - Continue hydration - Trend CPK in the a.m. - Associated AKI  Fracture of right ulnar styloid - ER reduction and splinting of wrist - Ortho consulted - Recommending admission to [facility].  Reportedly our Ortho spoke with the Ortho at [facility] -Pain control - Continue to monitor  Closed right radial fracture - Reduced and splinted in the ED - Pain control - Ortho at [facility] aware of patient  Closed fracture of shaft of femur (HCC) - Acute severely displaced and angulated midshaft fracture of the right femur on x-ray - Our Ortho recommends admission to [facility] -Pain control-n.p.o. after midnight  Hypothyroidism - With thyroid nodule seen on CT - Nonemergent ultrasound recommended - Continue Synthroid - Check TSH - Continue to monitor  Hyperlipidemia - Continue Lipitor  Dementia (HCC) - Continue Aricept  HTN (hypertension) - Continue Norvasc      Advance Care Planning:   Code Status: Full Code  Consults: Ortho  Family Communication: Family member at bedside  Severity of Illness: The appropriate patient status for this patient is INPATIENT. Inpatient status is judged to be reasonable and necessary in order to provide the required intensity of service to ensure the patient's safety. The patient's presenting symptoms, physical exam findings, and initial radiographic and laboratory data in the context of their chronic comorbidities is felt to place them at high risk for further clinical deterioration. Furthermore, it is not anticipated that the patient will be medically stable for discharge from the hospital within 2 midnights of admission.   * I certify that at the point of admission it is my clinical judgment that the patient will require inpatient hospital care spanning beyond 2 midnights from the point of admission due to high intensity of service, high risk for further deterioration and high frequency of surveillance required.*  Author: Lilyan Gilford, DO 10/03/2022 8:46 PM  For on call review [email or web address].

## 2022-10-03 NOTE — Assessment & Plan Note (Signed)
-   Baseline 1.00 - Today 1.83 - Likely related to dehydration and rhabdomyolysis - Continue IV hydration - Trend in the a.m.

## 2022-10-03 NOTE — Assessment & Plan Note (Signed)
Traumatic fracture  Plan to continue supportive medical therapy with analgesics, and DVT prophylaxis. Add PPI for gi prophylaxis.  Follow up with orthopedic surgery, PT and OT.

## 2022-10-03 NOTE — Assessment & Plan Note (Signed)
Blood pressure control with amlodipine

## 2022-10-03 NOTE — Assessment & Plan Note (Signed)
Continue Aricept

## 2022-10-03 NOTE — ED Triage Notes (Signed)
Pt BIB her daughter for a fall; pt c/o pain to right hip   Pt has bruising to right upper thigh and right arm  Pt has dementia  Pt as brought in by daughter and she states it took 4 people to get her in car. 4 staff members assisted pt to stretcher and brought to hallway

## 2022-10-03 NOTE — ED Notes (Signed)
Portable xray at bedside.

## 2022-10-03 NOTE — ED Provider Notes (Addendum)
[place] EMERGENCY DEPARTMENT AT [facility] Provider Note   CSN: [id number] Arrival date & time: 10/03/22  1223     History  Chief Complaint  Patient presents with   Susan Osborne is a 89 y.o. female.  Patient with history of hypertension, dementia presents from home with concerns for fall. Patient with dementia and therefore history provided mostly by daughter at bedside.  Patient reportedly lives alone, her niece stays with her during the day but the use at night.  She apparently left around 7 PM last night.  Patient's several children tried to get in touch with her last night but she did not pick up the phone and therefore they suspect that she fell soon after the niece left yesterday and was on the kitchen floor all night.  The patient's daughter came to the house to check on her and found her laying on the floor in the kitchen.  She is endorsing significant pain in her right wrist and right leg.  She has not been able to walk since.  She is not anticoagulated.  She is unable to clarify any details regarding the fall.  Patient's daughter does note that she has been slightly more confused the last few days, she saw her primary doctor for this and was told she had a urinary tract infection.  Her primary doctor called her in antibiotics but she has not filled the prescription yet.  Level 5 caveat--dementia  The history is provided by the patient. No language interpreter was used.  Fall       Home Medications Prior to Admission medications   Medication Sig Start Date End Date Taking? Authorizing Provider  amLODipine (NORVASC) 5 MG tablet Take 5 mg by mouth daily. 07/02/19   [provider]  atorvastatin (LIPITOR) 40 MG tablet Take 40 mg by mouth at bedtime. 04/03/19   [provider]  busPIRone (BUSPAR) 10 MG tablet Take 10 mg by mouth 2 (two) times daily. 04/08/20   [provider]  Calcium Carb-Cholecalciferol (CALCIUM 500 + D) 500-125  MG-UNIT TABS Take 1 tablet by mouth daily.    [provider]  donepezil (ARICEPT) 10 MG tablet Take 10 mg by mouth at bedtime. 09/30/19   [provider]  famotidine (PEPCID) 40 MG tablet Take 40 mg by mouth daily. 03/25/20   [provider]  HYDROcodone-acetaminophen (NORCO/VICODIN) 5-325 MG tablet Take 1 tablet by mouth in the morning, at noon, and at bedtime. 07/10/20   [provider]  levothyroxine (SYNTHROID) 25 MCG tablet Take 25 mcg by mouth daily before breakfast. 03/30/20   [provider]  ondansetron (ZOFRAN) 4 MG tablet Take 4 mg by mouth every 8 (eight) hours as needed for nausea or vomiting. 03/25/20   [provider]  venlafaxine XR (EFFEXOR-XR) 150 MG 24 hr capsule Take by mouth daily with breakfast. 04/08/20   [provider]  citalopram (CELEXA) 10 MG tablet citalopram 10 mg tablet Patient not taking: No sig reported  07/24/20  [provider]  DULoxetine (CYMBALTA) 30 MG capsule Take 30 mg by mouth at bedtime. Patient not taking: No sig reported 12/12/19 07/24/20  [provider]  enoxaparin (LOVENOX) 30 MG/0.3ML injection SMARTSIG:0.3 Milliliter(s) SUB-Q Daily Patient not taking: No sig reported 10/29/19 07/24/20  [provider]  furosemide (LASIX) 20 MG tablet furosemide 20 mg tablet  TK 1 T PO QAM Patient not taking: No sig reported  07/24/20  [provider]  omeprazole (PRILOSEC) 40 MG capsule omeprazole 40 mg capsule,delayed release Patient not taking: No sig reported 04/03/19 07/24/20  [provider]  potassium chloride (KLOR-CON) 10 MEQ tablet Take 10 mEq by mouth daily. Patient not taking: No sig reported 12/30/19 07/24/20  [provider]      Allergies    Codeine    Review of Systems   Review of Systems  Unable to perform ROS: Dementia    Physical Exam Updated Vital Signs BP 133/70 (BP Location: Left Arm)   Pulse 87   Temp 98.1 F (36.7 C) (Oral)    Resp 18   Ht 5\' 3"  (1.6 m)   Wt 81.6 kg   SpO2 98%   BMI 31.87 kg/m  Physical Exam Vitals and nursing note reviewed.  Constitutional:      General: She is not in acute distress.    Appearance: Normal appearance. She is normal weight. She is not ill-appearing, toxic-appearing or diaphoretic.  HENT:     Head: Normocephalic and atraumatic.     Comments: No racoon eyes No battle sign Eyes:     Extraocular Movements: Extraocular movements intact.     Pupils: Pupils are equal, round, and reactive to light.  Cardiovascular:     Rate and Rhythm: Normal rate and regular rhythm.     Heart sounds: Normal heart sounds.  Pulmonary:     Effort: Pulmonary effort is normal. No respiratory distress.     Breath sounds: Normal breath sounds.  Chest:     Chest wall: No tenderness.  Abdominal:     General: Abdomen is flat.     Palpations: Abdomen is soft.     Tenderness: There is no abdominal tenderness.  Musculoskeletal:        General: Normal range of motion.     Cervical back: Normal, normal range of motion and neck supple. No tenderness.     Thoracic back: Normal.     Lumbar back: Normal.     Comments: No midline tenderness, no stepoffs or deformity noted on palpation of cervical, thoracic, and lumbar spine  TTP of the right wrist with significant bruising and swelling.  ROM limited due to pain.  Obvious deformity present.  Distal sensation intact.  Radial and ulnar pulses intact and 2+.  Capillary refill less than 2 seconds.  TTP to the right hip and upper thigh area.  Obvious shortening and external rotation present. DP and PT pulses intact and 2+.  Skin:    General: Skin is warm and dry.  Neurological:     General: No focal deficit present.     Mental Status: She is alert. Mental status is at baseline.  Psychiatric:        Mood and Affect: Mood normal.        Behavior: Behavior normal.     ED Results / Procedures / Treatments   Labs (all labs ordered are listed, but only  abnormal results are displayed) Labs Reviewed  CBC - Abnormal; Notable for the following components:      Result Value   WBC 26.5 (*)    RBC 3.08 (*)    Hemoglobin 9.3 (*)    HCT 29.2 (*)    RDW 18.9 (*)    All other components within normal limits  COMPREHENSIVE METABOLIC PANEL - Abnormal; Notable for the following components:   Sodium 134 (*)    CO2 21 (*)    Glucose, Bld 169 (*)    BUN 46 (*)  Creatinine, Ser 1.83 (*)    Calcium 8.8 (*)    AST 74 (*)    GFR, Estimated 25 (*)    All other components within normal limits  CK - Abnormal; Notable for the following components:   Total CK 1,991 (*)    All other components within normal limits  URINALYSIS, ROUTINE W REFLEX MICROSCOPIC - Abnormal; Notable for the following components:   APPearance HAZY (*)    Hgb urine dipstick MODERATE (*)    Ketones, ur 5 (*)    Protein, ur 100 (*)    Leukocytes,Ua MODERATE (*)    Bacteria, UA MANY (*)    All other components within normal limits  URINE CULTURE    EKG EKG Interpretation Date/Time:  Monday October 03 2022 14:32:27 EDT Ventricular Rate:  84 PR Interval:  144 QRS Duration:  134 QT Interval:  428 QTC Calculation: 505 R Axis:   89  Text Interpretation: Normal sinus rhythm Left ventricular hypertrophy with QRS widening and repolarization abnormality ( Cornell product ) Abnormal ECG When compared with ECG of 21-Jul-2020 14:26, now LBBB Confirmed by Meridee Score [phone]) on 10/03/2022 2:34:54 PM  Radiology DG Ribs Unilateral W/Chest Right  Result Date: 10/03/2022 CLINICAL DATA:  Post fall, now with right-sided rib pain. EXAM: RIGHT RIBS AND CHEST - 3+ VIEW COMPARISON:  06/18/2020 FINDINGS: Unchanged enlarged cardiac silhouette and mediastinal contours with atherosclerotic plaque within thoracic aorta. There is persistent thickening the right paratracheal stripe presumably secondary to prominent vasculature. No discrete focal airspace opacities. No pleural effusion or  pneumothorax no evidence of edema. No acute osseous abnormalities. Specifically, no definite displaced right-sided rib fractures. Moderate to severe degenerative change of the right glenohumeral joint with joint space loss, subchondral sclerosis and inferiorly directed osteophytosis. IMPRESSION: 1. No acute cardiopulmonary disease. 2. No definite displaced right-sided rib fractures. 3. Moderate to severe degenerative change of the right glenohumeral joint. Electronically Signed   By: Simonne Come M.D.   On: 10/03/2022 17:07   CT Cervical Spine Wo Contrast  Result Date: 10/03/2022 CLINICAL DATA:  Neck trauma (Age >= 65y) EXAM: CT CERVICAL SPINE WITHOUT CONTRAST TECHNIQUE: Multidetector CT imaging of the cervical spine was performed without intravenous contrast. Multiplanar CT image reconstructions were also generated. RADIATION DOSE REDUCTION: This exam was performed according to the departmental dose-optimization program which includes automated exposure control, adjustment of the mA and/or kV according to patient size and/or use of iterative reconstruction technique. COMPARISON:  CTA neck 07/21/2020 FINDINGS: Alignment: Mild anterolisthesis of C3 on C4, C4 on C5, C5 on C6, and C7 on T1, most likely degenerative given degenerative changes at these levels and given similar alignment on prior CTA neck. Skull base and vertebrae: No acute fracture. No primary bone lesion or focal pathologic process. Soft tissues and spinal canal: No prevertebral fluid or swelling. No visible canal hematoma. Disc levels:  Moderate multilevel degenerative change. Upper chest: Visualized lung apices are clear. IMPRESSION: 1. No evidence of acute fracture or traumatic malalignment. 2. Large (4.5 cm) right thyroid nodule. Recommend thyroid ultrasound (ref: J Am Coll Radiol. 2015 Feb;12(2): 143-50). Electronically Signed   By: Feliberto Harts M.D.   On: 10/03/2022 16:40   CT Head Wo Contrast  Result Date: 10/03/2022 CLINICAL DATA:   Trauma EXAM: CT HEAD WITHOUT CONTRAST TECHNIQUE: Contiguous axial images were obtained from the base of the skull through the vertex without intravenous contrast. RADIATION DOSE REDUCTION: This exam was performed according to the departmental dose-optimization program which includes automated exposure  control, adjustment of the mA and/or kV according to patient size and/or use of iterative reconstruction technique. COMPARISON:  CT head 07/21/2020.  MRI brain 07/22/2020. FINDINGS: Brain: No evidence of acute infarction, hemorrhage, hydrocephalus, extra-axial collection or mass lesion/mass effect. Again seen is mild diffuse atrophy and mild periventricular white matter hypodensity, likely chronic small vessel ischemic change. Vascular: Atherosclerotic calcifications are present within the cavernous internal carotid arteries. Skull: Normal. Negative for fracture or focal lesion. Sinuses/Orbits: No acute finding. Other: None. IMPRESSION: 1. No acute intracranial abnormality. 2. Mild diffuse atrophy and mild chronic small vessel ischemic change. Electronically Signed   By: Darliss Cheney M.D.   On: 10/03/2022 16:36   DG Pelvis 1-2 Views  Result Date: 10/03/2022 CLINICAL DATA:  Right femur pain after fall today. EXAM: PELVIS - 1-2 VIEW COMPARISON:  None Available. FINDINGS: Status post surgical internal fixation of old unhealed proximal right femoral shaft fracture. No acute fracture or dislocation is noted. IMPRESSION: No acute abnormality seen. Electronically Signed   By: Lupita Raider M.D.   On: 10/03/2022 15:19   DG FEMUR, MIN 2 VIEWS RIGHT  Result Date: 10/03/2022 CLINICAL DATA:  Right femur pain after fall today. EXAM: RIGHT FEMUR 2 VIEWS COMPARISON:  July 21, 2020. FINDINGS: Status post surgical internal fixation of old proximal right femoral shaft fracture. There is now noted severely displaced and angulated fracture of the midshaft of the right femur. Status post right total knee arthroplasty.  IMPRESSION: Acute severely displaced and angulated midshaft fracture of the right femur. Electronically Signed   By: Lupita Raider M.D.   On: 10/03/2022 15:15   DG Shoulder Right  Result Date: 10/03/2022 CLINICAL DATA:  Right shoulder pain after fall today. EXAM: RIGHT SHOULDER - 2+ VIEW COMPARISON:  None Available. FINDINGS: There is no evidence of fracture or dislocation. Mild degenerative changes seen involving right acromioclavicular joint. Moderate degenerative joint disease of right glenohumeral joint is noted. Soft tissues are unremarkable. IMPRESSION: Degenerative changes as noted above.  No acute abnormality seen. Electronically Signed   By: Lupita Raider M.D.   On: 10/03/2022 15:12   DG Hand Complete Right  Result Date: 10/03/2022 CLINICAL DATA:  Right hand pain after fall today. EXAM: RIGHT HAND - COMPLETE 3+ VIEW COMPARISON:  None Available. FINDINGS: Moderately displaced and comminuted distal right radial fracture is noted. Severe degenerative changes seen involving the first carpometacarpal joint. Degenerative changes are seen involving multiple interphalangeal joints. IMPRESSION: Moderately displaced and comminuted distal right radial fracture. Minimally displaced ulnar styloid fracture. Electronically Signed   By: Lupita Raider M.D.   On: 10/03/2022 15:10   DG Wrist Complete Right  Result Date: 10/03/2022 CLINICAL DATA:  Right wrist pain after fall today. EXAM: RIGHT WRIST - COMPLETE 3+ VIEW COMPARISON:  None Available. FINDINGS: Moderately displaced and comminuted distal right radial fracture is noted with intra-articular extension. Minimally displaced ulnar styloid fracture is noted. IMPRESSION: Moderately displaced and comminuted distal right radial fracture with intra-articular extension. Minimally displaced ulnar styloid fracture. Electronically Signed   By: Lupita Raider M.D.   On: 10/03/2022 15:09    Procedures .Critical Care  Performed by: Silva Bandy,  PA-C Authorized by: Silva Bandy, PA-C   Critical care provider statement:    Critical care time (minutes):  30   Critical care was necessary to treat or prevent imminent or life-threatening deterioration of the following conditions:  Dehydration and trauma   Critical care was time spent personally by me  on the following activities:  Development of treatment plan with patient or surrogate, discussions with consultants, discussions with primary provider, evaluation of patient's response to treatment, examination of patient, obtaining history from patient or surrogate, ordering and review of laboratory studies, ordering and review of radiographic studies, pulse oximetry, re-evaluation of patient's condition and review of old charts   Care discussed with: admitting provider   Reduction of fracture  Date/Time: 10/03/2022 8:14 PM  Performed by: Silva Bandy, PA-C Authorized by: Silva Bandy, PA-C  Consent: Verbal consent obtained. Risks and benefits: risks, benefits and alternatives were discussed Consent given by: patient and power of attorney Patient understanding: patient states understanding of the procedure being performed Patient consent: the patient's understanding of the procedure matches consent given Procedure consent: procedure consent matches procedure scheduled Imaging studies: imaging studies available Required items: required blood products, implants, devices, and special equipment available Patient identity confirmed: verbally with patient and arm band Preparation: Patient was prepped and draped in the usual sterile fashion. Local anesthesia used: yes Anesthesia: hematoma block  Anesthesia: Local anesthesia used: yes Local Anesthetic: lidocaine 2% without epinephrine Anesthetic total: 10 mL  Sedation: Patient sedated: no  Patient tolerance: patient tolerated the procedure well with no immediate complications       Medications Ordered in ED Medications   lidocaine HCl (PF) (XYLOCAINE) 2 % injection 10 mL (has no administration in time range)  fentaNYL (SUBLIMAZE) injection 25 mcg (25 mcg Intravenous Given 10/03/22 1416)  sodium chloride 0.9 % bolus 1,000 mL (1,000 mLs Intravenous Bolus 10/03/22 1545)  cefTRIAXone (ROCEPHIN) 2 g in sodium chloride 0.9 % 100 mL IVPB (0 g Intravenous Stopped 10/03/22 1753)  fentaNYL (SUBLIMAZE) injection 50 mcg (50 mcg Intravenous Given 10/03/22 1849)    ED Course/ Medical Decision Making/ A&P Clinical Course as of 10/03/22 1923  Mon Oct 03, 2022  1417 Elderly female brought in by family after fall and right leg pain.  She has a periprosthetic fracture of the right femur along with probable other fractures including wrist.  Awaiting readings on radiology imaging and will ultimately need admission for orthopedic consult. [MB]    Clinical Course User Index [MB] Terrilee Files, MD                                 Medical Decision Making Amount and/or Complexity of Data Reviewed Labs: ordered. Radiology: ordered.  Risk Prescription drug management. Decision regarding hospitalization.   This patient is a 89 y.o. female who presents to the ED for concern of fall, this involves an extensive number of treatment options, and is a complaint that carries with it a high risk of complications and morbidity. The emergent differential diagnosis prior to evaluation includes, but is not limited to,  trauma, rhabdomyolysis, UTI . This is not an exhaustive differential.   Past Medical History / Co-morbidities / Social History:  has a past medical history of Arthritis, Dementia (HCC), Hypertension, and Stroke (HCC).  Additional history: Chart reviewed.  Physical Exam: Physical exam performed. The pertinent findings include: per above, obvious deformity to mid shaft femur on the right and right wrist. Good pulses distally, compartments soft  Lab Tests: I ordered, and personally interpreted labs.  The pertinent results  include:  WBC 26.5, hgb 9.3 down from 11.4 on 9/18, UA infectious, culture pending. Creatinine 1.83 up from 1.12 on 9/18. CK 1,991   Imaging Studies: I ordered imaging studies including  CT head, cervical spine, CXR, DG pelvis, right femur, right shoulder, right hand, and right wrist. I independently visualized and interpreted imaging which showed   CT: NAD, Large (4.5 cm) right thyroid nodule. Recommend thyroid ultrasound   DG right wrist: Moderately displaced and comminuted distal right radial fracture with intra-articular extension. Minimally displaced ulnar styloid fracture.  DG right femur: Acute severely displaced and angulated midshaft fracture of the right femur.  DG right shoulder and chest: NAD   I agree with the radiologist interpretation.   Cardiac Monitoring:  The patient was maintained on a cardiac monitor.  My attending physician Dr. Charm Barges viewed and interpreted the cardiac monitored which showed an underlying rhythm of: sinus rhythm. I agree with this interpretation.   Medications: I ordered medication including fentanyl, rocephin, fluids  for pain, UTI, rhabdomyolysis. Reevaluation of the patient after these medicines showed that the patient improved. I have reviewed the patients home medicines and have made adjustments as needed.  Consultations Obtained: I requested consultation with the orthopedics on call Dr. Dallas Schimke,  and discussed lab and imaging findings as well as pertinent plan - they recommend: admission to [facility] for operative management of the right femur fracture. Attempt reduction of right wrist fracture and splint. Admit to medicine for management of UTI and rhabdo. Dr Dallas Schimke has spoken to orthopedic surgery on-call at [facility] Dr. Carola Frost who will see the patient when she arrives to [facility].  Procedure: Patient with hematoma block performed and subsequently placed in finger traps and wrist realigned and splinted.  Procedure was well-tolerated per above.    Disposition: After consideration of the diagnostic results and the patients response to treatment, I feel that patient will require admission to [facility] for her displaced femur fracture per orthopedic surgery recommendations., her right wrist fracture, her UTI, and rhabdomyolysis.  Discussed same with patient and family at bedside who are understanding and in agreement with this plan.  Discussed patient with hospitalist who accepts patient for admission.   I discussed this case with my attending physician Dr. Charm Barges who cosigned this note including patient's presenting symptoms, physical exam, and planned diagnostics and interventions. Attending physician stated agreement with plan or made changes to plan which were implemented.    Final Clinical Impression(s) / ED Diagnoses Final diagnoses:  Fall, initial encounter  Acute cystitis with hematuria  Traumatic rhabdomyolysis, initial encounter [facility])  Closed fracture of distal end of right radius, unspecified fracture morphology, initial encounter  Closed displaced fracture of styloid process of right ulna, initial encounter  Closed fracture of right femur, unspecified fracture morphology, initial encounter [facility])    Rx / DC Orders ED Discharge Orders     None         Silva Bandy, PA-C 10/03/22 2027    Quashon Jesus, Shawn Route, PA-C 10/03/22 2038    Pricilla Loveless, MD 10/10/22 [phone]

## 2022-10-03 NOTE — ED Notes (Signed)
Patient placed in finger trap traction per order. PA and MD notified

## 2022-10-03 NOTE — Assessment & Plan Note (Signed)
-   CPK nearly 2000 - 1 L fluid given in the ED - Continue hydration - Trend CPK in the a.m. - Associated AKI

## 2022-10-03 NOTE — ED Notes (Addendum)
Inquired provider about reducing patient's wrist for splint. PA states the finger trap countertraction would be enough to reduce and to splint as is. MD to bedside to verify patient wrist ready to splint. MD confirms to this RN and another RN to splint wrist as is.

## 2022-10-03 NOTE — ED Notes (Signed)
ED TO INPATIENT HANDOFF REPORT  ED Nurse Name and Phone #: Jacques Earthly Name/Age/Gender Frances Nickels 89 y.o. female Room/Bed: APA02/APA02  Code Status   Code Status: Full Code  Home/SNF/Other   Patient oriented to: self, time Is this baseline? Yes   Triage Complete: Triage complete  Chief Complaint Fall [W19.XXXA]  Triage Note Pt BIB her daughter for a fall; pt c/o pain to right hip   Pt has bruising to right upper thigh and right arm  Pt has dementia  Pt as brought in by daughter and she states it took 4 people to get her in car. 4 staff members assisted pt to stretcher and brought to hallway   Allergies Allergies  Allergen Reactions   Codeine     Level of Care/Admitting Diagnosis ED Disposition     ED Disposition  Admit   Condition  --   Comment  Hospital Area: [facility] [[id number]]  Level of Care: Med-Surg [16]  May admit patient to [facility] or [facility] if equivalent level of care is available:: No  Covid Evaluation: Asymptomatic - no recent exposure (last 10 days) testing not required  Diagnosis: Fall [290176]  Admitting Physician: Lilyan Gilford [[id number]]  Attending Physician: Lilyan Gilford [[id number]]  Certification:: I certify this patient will need inpatient services for at least 2 midnights  Expected Medical Readiness: 10/06/2022          B Medical/Surgery History Past Medical History:  Diagnosis Date   Arthritis    Dementia (HCC)    Hypertension    Stroke [facility])    Past Surgical History:  Procedure Laterality Date   ABDOMINAL HYSTERECTOMY     FOOT SURGERY Right    HIP SURGERY Right    REPLACEMENT TOTAL KNEE Right      A IV Location/Drains/Wounds Patient Lines/Drains/Airways Status     Active Line/Drains/Airways     Name Placement date Placement time Site Days   Peripheral IV 10/03/22 20 G 1" Left Antecubital 10/03/22  1400  Antecubital  less than 1   Urethral Catheter Sydney S NT+3 Straight-tip  14 Fr. 10/03/22  1638  Straight-tip  less than 1            Intake/Output Last 24 hours  Intake/Output Summary (Last 24 hours) at 10/03/2022 2304 Last data filed at 10/03/2022 1753 Gross per 24 hour  Intake 100 ml  Output --  Net 100 ml    Labs/Imaging Results for orders placed or performed during the hospital encounter of 10/03/22 (from the past 48 hour(s))  CBC     Status: Abnormal   Collection Time: 10/03/22  1:54 PM  Result Value Ref Range   WBC 26.5 (H) 4.0 - 10.5 K/uL   RBC 3.08 (L) 3.87 - 5.11 MIL/uL   Hemoglobin 9.3 (L) 12.0 - 15.0 g/dL   HCT 62.1 (L) 30.8 - 65.7 %   MCV 94.8 80.0 - 100.0 fL   MCH 30.2 26.0 - 34.0 pg   MCHC 31.8 30.0 - 36.0 g/dL   RDW 84.6 (H) 96.2 - 95.2 %   Platelets 178 150 - 400 K/uL   nRBC 0.0 0.0 - 0.2 %    Comment: Performed at [facility], [address] [place], [place] [address]  Comprehensive metabolic panel     Status: Abnormal   Collection Time: 10/03/22  1:54 PM  Result Value Ref Range   Sodium 134 (L) 135 - 145 mmol/L   Potassium 3.9 3.5 - 5.1  mmol/L   Chloride 102 98 - 111 mmol/L   CO2 21 (L) 22 - 32 mmol/L   Glucose, Bld 169 (H) 70 - 99 mg/dL    Comment: Glucose reference range applies only to samples taken after fasting for at least 8 hours.   BUN 46 (H) 8 - 23 mg/dL   Creatinine, Ser 4.09 (H) 0.44 - 1.00 mg/dL   Calcium 8.8 (L) 8.9 - 10.3 mg/dL   Total Protein 6.9 6.5 - 8.1 g/dL   Albumin 3.5 3.5 - 5.0 g/dL   AST 74 (H) 15 - 41 U/L   ALT 25 0 - 44 U/L   Alkaline Phosphatase 83 38 - 126 U/L   Total Bilirubin 0.7 0.3 - 1.2 mg/dL   GFR, Estimated 25 (L) >60 mL/min    Comment: (NOTE) Calculated using the CKD-EPI Creatinine Equation (2021)    Anion gap 11 5 - 15    Comment: Performed at [facility], [address], [place], [place] [address]  CK     Status: Abnormal   Collection Time: 10/03/22  1:54 PM  Result Value Ref Range   Total CK 1,991 (H) 38 - 234 U/L    Comment: Performed at [facility], [address],  [place], [place] [address]  Urinalysis, Routine w reflex microscopic -Urine, Clean Catch     Status: Abnormal   Collection Time: 10/03/22  4:39 PM  Result Value Ref Range   Color, Urine YELLOW YELLOW   APPearance HAZY (A) CLEAR   Specific Gravity, Urine 1.017 1.005 - 1.030   pH 5.0 5.0 - 8.0   Glucose, UA NEGATIVE NEGATIVE mg/dL   Hgb urine dipstick MODERATE (A) NEGATIVE   Bilirubin Urine NEGATIVE NEGATIVE   Ketones, ur 5 (A) NEGATIVE mg/dL   Protein, ur 562 (A) NEGATIVE mg/dL   Nitrite NEGATIVE NEGATIVE   Leukocytes,Ua MODERATE (A) NEGATIVE   RBC / HPF 0-5 0 - 5 RBC/hpf   WBC, UA 21-50 0 - 5 WBC/hpf   Bacteria, UA MANY (A) NONE SEEN   Squamous Epithelial / HPF 0-5 0 - 5 /HPF   WBC Clumps PRESENT    Mucus PRESENT     Comment: Performed at [facility], [address], [place], [place] [address]   DG Wrist Complete Right  Result Date: 10/03/2022 CLINICAL DATA:  Post reduction EXAM: RIGHT WRIST - COMPLETE 3+ VIEW COMPARISON:  10/03/2022 FINDINGS: Casting material limits bone detail. Bones appear demineralized. Acute comminuted intra-articular distal radius fracture with impaction. Residual dorsal angulation of distal fracture fragment not much changed in the interval. Similar alignment of minimally displaced ulnar styloid fracture IMPRESSION: Similar alignment of distal radius and ulnar styloid fractures. Electronically Signed   By: Jasmine Pang M.D.   On: 10/03/2022 22:49   DG Ribs Unilateral W/Chest Right  Result Date: 10/03/2022 CLINICAL DATA:  Post fall, now with right-sided rib pain. EXAM: RIGHT RIBS AND CHEST - 3+ VIEW COMPARISON:  06/18/2020 FINDINGS: Unchanged enlarged cardiac silhouette and mediastinal contours with atherosclerotic plaque within thoracic aorta. There is persistent thickening the right paratracheal stripe presumably secondary to prominent vasculature. No discrete focal airspace opacities. No pleural effusion or pneumothorax no evidence of edema. No acute osseous  abnormalities. Specifically, no definite displaced right-sided rib fractures. Moderate to severe degenerative change of the right glenohumeral joint with joint space loss, subchondral sclerosis and inferiorly directed osteophytosis. IMPRESSION: 1. No acute cardiopulmonary disease. 2. No definite displaced right-sided rib fractures. 3. Moderate to severe degenerative change of the right  glenohumeral joint. Electronically Signed   By: Simonne Come M.D.   On: 10/03/2022 17:07   CT Cervical Spine Wo Contrast  Result Date: 10/03/2022 CLINICAL DATA:  Neck trauma (Age >= 65y) EXAM: CT CERVICAL SPINE WITHOUT CONTRAST TECHNIQUE: Multidetector CT imaging of the cervical spine was performed without intravenous contrast. Multiplanar CT image reconstructions were also generated. RADIATION DOSE REDUCTION: This exam was performed according to the departmental dose-optimization program which includes automated exposure control, adjustment of the mA and/or kV according to patient size and/or use of iterative reconstruction technique. COMPARISON:  CTA neck 07/21/2020 FINDINGS: Alignment: Mild anterolisthesis of C3 on C4, C4 on C5, C5 on C6, and C7 on T1, most likely degenerative given degenerative changes at these levels and given similar alignment on prior CTA neck. Skull base and vertebrae: No acute fracture. No primary bone lesion or focal pathologic process. Soft tissues and spinal canal: No prevertebral fluid or swelling. No visible canal hematoma. Disc levels:  Moderate multilevel degenerative change. Upper chest: Visualized lung apices are clear. IMPRESSION: 1. No evidence of acute fracture or traumatic malalignment. 2. Large (4.5 cm) right thyroid nodule. Recommend thyroid ultrasound (ref: J Am Coll Radiol. 2015 Feb;12(2): 143-50). Electronically Signed   By: Feliberto Harts M.D.   On: 10/03/2022 16:40   CT Head Wo Contrast  Result Date: 10/03/2022 CLINICAL DATA:  Trauma EXAM: CT HEAD WITHOUT CONTRAST TECHNIQUE:  Contiguous axial images were obtained from the base of the skull through the vertex without intravenous contrast. RADIATION DOSE REDUCTION: This exam was performed according to the departmental dose-optimization program which includes automated exposure control, adjustment of the mA and/or kV according to patient size and/or use of iterative reconstruction technique. COMPARISON:  CT head 07/21/2020.  MRI brain 07/22/2020. FINDINGS: Brain: No evidence of acute infarction, hemorrhage, hydrocephalus, extra-axial collection or mass lesion/mass effect. Again seen is mild diffuse atrophy and mild periventricular white matter hypodensity, likely chronic small vessel ischemic change. Vascular: Atherosclerotic calcifications are present within the cavernous internal carotid arteries. Skull: Normal. Negative for fracture or focal lesion. Sinuses/Orbits: No acute finding. Other: None. IMPRESSION: 1. No acute intracranial abnormality. 2. Mild diffuse atrophy and mild chronic small vessel ischemic change. Electronically Signed   By: Darliss Cheney M.D.   On: 10/03/2022 16:36   DG Pelvis 1-2 Views  Result Date: 10/03/2022 CLINICAL DATA:  Right femur pain after fall today. EXAM: PELVIS - 1-2 VIEW COMPARISON:  None Available. FINDINGS: Status post surgical internal fixation of old unhealed proximal right femoral shaft fracture. No acute fracture or dislocation is noted. IMPRESSION: No acute abnormality seen. Electronically Signed   By: Lupita Raider M.D.   On: 10/03/2022 15:19   DG FEMUR, MIN 2 VIEWS RIGHT  Result Date: 10/03/2022 CLINICAL DATA:  Right femur pain after fall today. EXAM: RIGHT FEMUR 2 VIEWS COMPARISON:  July 21, 2020. FINDINGS: Status post surgical internal fixation of old proximal right femoral shaft fracture. There is now noted severely displaced and angulated fracture of the midshaft of the right femur. Status post right total knee arthroplasty. IMPRESSION: Acute severely displaced and angulated midshaft  fracture of the right femur. Electronically Signed   By: Lupita Raider M.D.   On: 10/03/2022 15:15   DG Shoulder Right  Result Date: 10/03/2022 CLINICAL DATA:  Right shoulder pain after fall today. EXAM: RIGHT SHOULDER - 2+ VIEW COMPARISON:  None Available. FINDINGS: There is no evidence of fracture or dislocation. Mild degenerative changes seen involving right acromioclavicular joint. Moderate degenerative  joint disease of right glenohumeral joint is noted. Soft tissues are unremarkable. IMPRESSION: Degenerative changes as noted above.  No acute abnormality seen. Electronically Signed   By: Lupita Raider M.D.   On: 10/03/2022 15:12   DG Hand Complete Right  Result Date: 10/03/2022 CLINICAL DATA:  Right hand pain after fall today. EXAM: RIGHT HAND - COMPLETE 3+ VIEW COMPARISON:  None Available. FINDINGS: Moderately displaced and comminuted distal right radial fracture is noted. Severe degenerative changes seen involving the first carpometacarpal joint. Degenerative changes are seen involving multiple interphalangeal joints. IMPRESSION: Moderately displaced and comminuted distal right radial fracture. Minimally displaced ulnar styloid fracture. Electronically Signed   By: Lupita Raider M.D.   On: 10/03/2022 15:10   DG Wrist Complete Right  Result Date: 10/03/2022 CLINICAL DATA:  Right wrist pain after fall today. EXAM: RIGHT WRIST - COMPLETE 3+ VIEW COMPARISON:  None Available. FINDINGS: Moderately displaced and comminuted distal right radial fracture is noted with intra-articular extension. Minimally displaced ulnar styloid fracture is noted. IMPRESSION: Moderately displaced and comminuted distal right radial fracture with intra-articular extension. Minimally displaced ulnar styloid fracture. Electronically Signed   By: Lupita Raider M.D.   On: 10/03/2022 15:09    Pending Labs Unresulted Labs (From admission, onward)     Start     Ordered   10/04/22 0500  CK  Tomorrow morning,   R         10/03/22 2039   10/03/22 1354  Urine Culture  Once,   URGENT       Question Answer Comment  Indication Altered mental status (if no other cause identified)   Release to patient Immediate      10/03/22 1354   Signed and Held  Comprehensive metabolic panel  Tomorrow morning,   R        Signed and Held   Signed and Held  Magnesium  Tomorrow morning,   R        Signed and Held   Signed and Held  CBC with Differential/Platelet  Tomorrow morning,   R        Signed and Held   Signed and Held  TSH  Tomorrow morning,   R        Signed and Held            Vitals/Pain Today's Vitals   10/03/22 2133 10/03/22 2150 10/03/22 2204 10/03/22 2230  BP: (!) 119/54  (!) 105/50 (!) 112/54  Pulse: 86  89 88  Resp:    18  Temp:      TempSrc:      SpO2: 94%  95% 96%  Weight:      Height:      PainSc:  5       Isolation Precautions No active isolations  Medications Medications  morphine (PF) 2 MG/ML injection 2 mg (2 mg Intravenous Given 10/03/22 2121)  cefTRIAXone (ROCEPHIN) 1 g in sodium chloride 0.9 % 100 mL IVPB (has no administration in time range)  fentaNYL (SUBLIMAZE) injection 25 mcg (25 mcg Intravenous Given 10/03/22 1416)  sodium chloride 0.9 % bolus 1,000 mL (1,000 mLs Intravenous Bolus 10/03/22 1545)  lidocaine HCl (PF) (XYLOCAINE) 2 % injection 10 mL (10 mLs Infiltration Given 10/03/22 1915)  cefTRIAXone (ROCEPHIN) 2 g in sodium chloride 0.9 % 100 mL IVPB (0 g Intravenous Stopped 10/03/22 1753)  fentaNYL (SUBLIMAZE) injection 50 mcg (50 mcg Intravenous Given 10/03/22 1849)    Mobility non-ambulatory     Focused Assessments  R Recommendations: See Admitting Provider Note  Report given to:   Additional Notes: A&Ox2; 20G LAC; 57F Foley

## 2022-10-04 DIAGNOSIS — F0390 Unspecified dementia without behavioral disturbance: Secondary | ICD-10-CM | POA: Diagnosis not present

## 2022-10-04 DIAGNOSIS — S52501A Unspecified fracture of the lower end of right radius, initial encounter for closed fracture: Secondary | ICD-10-CM | POA: Diagnosis not present

## 2022-10-04 DIAGNOSIS — S72351A Displaced comminuted fracture of shaft of right femur, initial encounter for closed fracture: Secondary | ICD-10-CM | POA: Diagnosis not present

## 2022-10-04 DIAGNOSIS — N179 Acute kidney failure, unspecified: Secondary | ICD-10-CM | POA: Diagnosis not present

## 2022-10-04 LAB — CBC WITH DIFFERENTIAL/PLATELET
Abs Immature Granulocytes: 0.16 10*3/uL — ABNORMAL HIGH (ref 0.00–0.07)
Basophils Absolute: 0 10*3/uL (ref 0.0–0.1)
Basophils Relative: 0 %
Eosinophils Absolute: 0 10*3/uL (ref 0.0–0.5)
Eosinophils Relative: 0 %
HCT: 25.8 % — ABNORMAL LOW (ref 36.0–46.0)
Hemoglobin: 8.1 g/dL — ABNORMAL LOW (ref 12.0–15.0)
Immature Granulocytes: 1 %
Lymphocytes Relative: 7 %
Lymphs Abs: 1.5 10*3/uL (ref 0.7–4.0)
MCH: 30.1 pg (ref 26.0–34.0)
MCHC: 31.4 g/dL (ref 30.0–36.0)
MCV: 95.9 fL (ref 80.0–100.0)
Monocytes Absolute: 2.4 10*3/uL — ABNORMAL HIGH (ref 0.1–1.0)
Monocytes Relative: 10 %
Neutro Abs: 18.5 10*3/uL — ABNORMAL HIGH (ref 1.7–7.7)
Neutrophils Relative %: 82 %
Platelets: 168 10*3/uL (ref 150–400)
RBC: 2.69 MIL/uL — ABNORMAL LOW (ref 3.87–5.11)
RDW: 19 % — ABNORMAL HIGH (ref 11.5–15.5)
WBC: 22.5 10*3/uL — ABNORMAL HIGH (ref 4.0–10.5)
nRBC: 0.1 % (ref 0.0–0.2)

## 2022-10-04 LAB — COMPREHENSIVE METABOLIC PANEL
ALT: 30 U/L (ref 0–44)
AST: 70 U/L — ABNORMAL HIGH (ref 15–41)
Albumin: 2.8 g/dL — ABNORMAL LOW (ref 3.5–5.0)
Alkaline Phosphatase: 69 U/L (ref 38–126)
Anion gap: 12 (ref 5–15)
BUN: 44 mg/dL — ABNORMAL HIGH (ref 8–23)
CO2: 19 mmol/L — ABNORMAL LOW (ref 22–32)
Calcium: 8.2 mg/dL — ABNORMAL LOW (ref 8.9–10.3)
Chloride: 103 mmol/L (ref 98–111)
Creatinine, Ser: 1.48 mg/dL — ABNORMAL HIGH (ref 0.44–1.00)
GFR, Estimated: 33 mL/min — ABNORMAL LOW (ref >60)
Glucose, Bld: 97 mg/dL (ref 70–99)
Potassium: 4 mmol/L (ref 3.5–5.1)
Sodium: 134 mmol/L — ABNORMAL LOW (ref 135–145)
Total Bilirubin: 1 mg/dL (ref 0.3–1.2)
Total Protein: 6 g/dL — ABNORMAL LOW (ref 6.5–8.1)

## 2022-10-04 LAB — CK: Total CK: 1138 U/L — ABNORMAL HIGH (ref 38–234)

## 2022-10-04 LAB — TSH: TSH: 0.651 u[IU]/mL (ref 0.350–4.500)

## 2022-10-04 LAB — MAGNESIUM: Magnesium: 2.1 mg/dL (ref 1.7–2.4)

## 2022-10-04 MED ORDER — SODIUM CHLORIDE 0.9 % IV SOLN: INTRAVENOUS | Status: DC

## 2022-10-04 MED ORDER — OXYCODONE HCL 5 MG PO TABS
5.0000 mg | ORAL_TABLET | ORAL | Status: DC | PRN
  Administered 2022-10-04 – 2022-10-09 (×10): 5 mg via ORAL
  Filled 2022-10-04 (×10): qty 1

## 2022-10-04 MED ORDER — BUSPIRONE HCL 10 MG PO TABS
10.0000 mg | ORAL_TABLET | Freq: Two times a day (BID) | ORAL | Status: DC
  Administered 2022-10-04 – 2022-10-10 (×13): 10 mg via ORAL
  Filled 2022-10-04 (×13): qty 1

## 2022-10-04 MED ORDER — ONDANSETRON HCL 4 MG PO TABS: 4.0000 mg | ORAL_TABLET | Freq: Four times a day (QID) | ORAL | Status: DC | PRN

## 2022-10-04 MED ORDER — CHLORHEXIDINE GLUCONATE CLOTH 2 % EX PADS
6.0000 | MEDICATED_PAD | Freq: Every day | CUTANEOUS | Status: DC
  Administered 2022-10-04 – 2022-10-10 (×6): 6 via TOPICAL

## 2022-10-04 MED ORDER — LEVOTHYROXINE SODIUM 25 MCG PO TABS
25.0000 ug | ORAL_TABLET | Freq: Every day | ORAL | Status: DC
  Administered 2022-10-04 – 2022-10-10 (×7): 25 ug via ORAL
  Filled 2022-10-04 (×7): qty 1

## 2022-10-04 MED ORDER — ONDANSETRON HCL 4 MG/2ML IJ SOLN: 4.0000 mg | Freq: Four times a day (QID) | INTRAMUSCULAR | Status: DC | PRN

## 2022-10-04 MED ORDER — FAMOTIDINE 20 MG PO TABS
20.0000 mg | ORAL_TABLET | Freq: Every day | ORAL | Status: DC
  Administered 2022-10-04 – 2022-10-10 (×6): 20 mg via ORAL
  Filled 2022-10-04 (×6): qty 1

## 2022-10-04 MED ORDER — VENLAFAXINE HCL ER 150 MG PO CP24
150.0000 mg | ORAL_CAPSULE | Freq: Two times a day (BID) | ORAL | Status: DC
  Administered 2022-10-04 – 2022-10-10 (×12): 150 mg via ORAL
  Filled 2022-10-04 (×12): qty 1

## 2022-10-04 MED ORDER — DONEPEZIL HCL 10 MG PO TABS
10.0000 mg | ORAL_TABLET | Freq: Every day | ORAL | Status: DC
  Administered 2022-10-04 – 2022-10-09 (×7): 10 mg via ORAL
  Filled 2022-10-04 (×7): qty 1

## 2022-10-04 MED ORDER — ACETAMINOPHEN 325 MG PO TABS
650.0000 mg | ORAL_TABLET | Freq: Four times a day (QID) | ORAL | Status: DC | PRN
  Administered 2022-10-05: 650 mg via ORAL
  Filled 2022-10-04: qty 2

## 2022-10-04 MED ORDER — ACETAMINOPHEN 650 MG RE SUPP: 650.0000 mg | Freq: Four times a day (QID) | RECTAL | Status: DC | PRN

## 2022-10-04 MED ORDER — AMLODIPINE BESYLATE 5 MG PO TABS
5.0000 mg | ORAL_TABLET | Freq: Every day | ORAL | Status: DC
  Administered 2022-10-04 – 2022-10-10 (×6): 5 mg via ORAL
  Filled 2022-10-04 (×6): qty 1

## 2022-10-04 MED ORDER — ATORVASTATIN CALCIUM 40 MG PO TABS: 40.0000 mg | ORAL_TABLET | Freq: Every day | ORAL | Status: DC

## 2022-10-04 NOTE — Plan of Care (Signed)
  Problem: Health Behavior/Discharge Planning: Goal: Ability to manage health-related needs will improve Outcome: Progressing   Problem: Clinical Measurements: Goal: Will remain free from infection Outcome: Progressing   Problem: Clinical Measurements: Goal: Respiratory complications will improve Outcome: Progressing   Problem: Nutrition: Goal: Adequate nutrition will be maintained Outcome: Progressing   Problem: Nutrition: Goal: Adequate nutrition will be maintained Outcome: Progressing   Problem: Coping: Goal: Level of anxiety will decrease Outcome: Progressing   Problem: Elimination: Goal: Will not experience complications related to urinary retention Outcome: Progressing   Problem: Pain Managment: Goal: General experience of comfort will improve Outcome: Progressing   Problem: Safety: Goal: Ability to remain free from injury will improve Outcome: Progressing   Problem: Skin Integrity: Goal: Risk for impaired skin integrity will decrease Outcome: Progressing   Problem: Education: Goal: Verbalization of understanding the information provided (i.e., activity precautions, restrictions, etc) will improve Outcome: Progressing   Problem: Pain Management: Goal: Pain level will decrease Outcome: Progressing

## 2022-10-04 NOTE — Progress Notes (Signed)
Orthopedic Tech Progress Note Patient Details:  Susan Osborne 06/28/1927 [id number]  Musculoskeletal Traction Type of Traction: Bucks Skin Traction Traction Location: RLE Traction Weight: 10 lbs   Post Interventions Patient Tolerated: Well  Deina Lipsey A Rehan Holness 10/04/2022, 11:14 AM

## 2022-10-04 NOTE — Progress Notes (Addendum)
Progress Note   Patient: Susan Osborne WUJ:[id number] DOB: 01-Mar-1927 DOA: 10/03/2022     1 DOS: the patient was seen and examined on 10/04/2022   Brief hospital course: Mrs. Susan Osborne was admitted to the hospital with the working diagnosis of right ulnare styloid fracture.  89 yo female with the past medical history of dementia, arthritis, hypertension, hypothyroidism, hyperlipidemia, and recurrent urinary tract infections who presented after a mechanical fall. Patient was found down on her kitchen floor, landing on her right side. Apparently she had a fall from her own height while ambulating. It is estimated that she spent about 14 to 16 hrs on the floor before she was found. She was recently diagnosed with urinary tract infection as outpatient. She lives alone and uses a walker for ambulation. She required four people to lift from the floor and get into the care.  In the ED, her blood pressure was 109/50, HR 86, RR 18 and 02 saturation 94% on room air, lungs with no wheezing or rales, heart with S1 and S2 present and regular, abdomen with no distention and no lower extremity edema. Right leg shortened compared to the left.   Na 134, K 3,9 Cl 102, bicarbonate 21, glucose 169, BUN 46, cr 1,83  AST 74, ALT 25,  BNP 1,991  CK 1,991 and 1,138  Wbc 26.5 hgb 9.3 plt 178   Urine analysis SG 1,017, protein 100, leukocytes moderate, 21-50 wbc, moderate Hgb, with 0-5 RBC.   CT cervical spine with no evidence of acute fracture or traumatic malalignment. Large 4,5 cm right thyroid nodule. (Recommended thyroid ultrasound).  CT head with no acute intracranial abnormalities, mild diffuse atrophy and mild chronic small vessel ischemic change.   Right femur radiograph with acute severely displaced and angulated midshaft fracture of the right femur.  Right hand radiograph with moderately displaced and comminuted distal right radial fracture.  Minimally displaced ulnar styloid fracture.  Right wrist radiograph  with displaced and comminuted distal radial fracture with intra articular extension. Minimally displaced ulnar styloid fracture.   Chest radiograph with no infiltrates or effusions, no rib fractures or pneumothorax.   EKG 84 bpm, normal axis, qtc 505, left bundle branch block, sinus rhythm with J point elevation V1 to V4, with poor R R wave progression, negative T wave lead II, III, AvF.   Patient was placed on analgesics and IV fluids.  Transferred from [facility] to [facility] for orthopedic procedure.   Assessment and Plan: Closed fracture of shaft of femur (HCC) Traumatic fracture  Plan to continue supportive medical therapy with analgesics, and DVT prophylaxis. Add PPI for gi prophylaxis.  Follow up with orthopedic surgery, PT and OT.   Closed right radial fracture Right wrist fracture. Continue immobilization and pain control. Follow further recommendations from orthopedics.    AKI (acute kidney injury) (HCC) Rhabdomyolysis. Hyponatremia.   CK is trending down, renal function with serum cr at 1,48 with K at 4,0 and serum bicarbonate at 19.  Anion gap is 12.  Na 134   Elevated AST to 70 (trending down).  Plan to continue IV fluids with isotonic saline at 100 cc pr her. Follow up renal function and electrolytes in am.  Follow up Ck (it is trending down).   HTN (hypertension) Blood pressure control with amlodipine   Hyperlipidemia Hold on atorvastatin for now considering elevated Ck.  Possible resumption at the time of discharge.   Dementia [facility]) She has been living alone with supervision of her family. She may need further  assistance at the time of her discharge.  No agitation. She has high risk of developing delirium.  Continue with donepezil, buspirone, and venlafaxine.   Hypothyroidism Continue with levothyroxine.  Follow up thyroid nodule as outpatient.   UTI (urinary tract infection) Urinary tract infection present on admission with no sepsis.  Plan to continue  antibiotic therapy with ceftriaxone and follow up urine cultures.  Wbc is 22,5 with 18,5 PMN.    Subjective: Patient complains of pain right wrist and right leg, no chest pain or dyspnea, no nausea or vomiting, she has some disorientation but not agitation. Her daughter is at the bedside   Physical Exam: Vitals:   10/04/22 0057 10/04/22 0512 10/04/22 0853 10/04/22 1350  BP: (!) 116/54 (!) 120/53 (!) 129/59 (!) 140/74  Pulse: 91 93 89 88  Resp: 20 20 19 20   Temp: 98.8 F (37.1 C) 98.5 F (36.9 C) 97.6 F (36.4 C) 97.7 F (36.5 C)  TempSrc: Oral Oral Oral Oral  SpO2: 99% 96% 95% 97%  Weight:      Height:       Neurology awake and alert ENT with mild pallor  Cardiovascular with S1 and S2 present and regular with no gallops, rubs or murmurs Respiratory with no rales or wheezing, no rhonchi Abdomen with no distention  Right leg shortened compared to the left. Right upper extremity with cast in place.  Data Reviewed:    Family Communication: I spoke with patient's daughter at the bedside, we talked in detail about patient's condition, plan of care and prognosis and all questions were addressed.   Disposition: Status is: Inpatient Remains inpatient appropriate because: IV fluids and orthopedic intervention   Planned Discharge Destination: Home     Author: Coralie Keens, MD 10/04/2022 4:35 PM  For on call review [email or web address].

## 2022-10-04 NOTE — Plan of Care (Signed)
  Problem: Health Behavior/Discharge Planning: Goal: Ability to manage health-related needs will improve Outcome: Progressing   

## 2022-10-04 NOTE — Assessment & Plan Note (Signed)
Rhabdomyolysis. Hyponatremia.   CK is trending down, renal function with serum cr at 1,48 with K at 4,0 and serum bicarbonate at 19.  Anion gap is 12.  Na 134   Elevated AST to 70 (trending down).  Plan to continue IV fluids with isotonic saline at 100 cc pr her. Follow up renal function and electrolytes in am.  Follow up Ck (it is trending down).

## 2022-10-04 NOTE — Consult Note (Signed)
Reason for Consult:Polytrauma Referring Physician: Delrae Sawyers Arrien Time called: 0845 Time at bedside: 42   Susan Osborne is an 89 y.o. female.  HPI: Susan Osborne was in her kitchen and fell. She's unsure what caused the fall. She had immediate right leg pain and could not get up. She lay on the floor for several hours until she was found. She was brought to [facility] where workup showed right wrist and femur fxs. Due to the complexity of the femur fx orthopedic trauma consultation was requested and she was transferred to [facility] for definitive care. She lives at home alone and typically uses a RW or cane for ambulation.  Past Medical History:  Diagnosis Date   Arthritis    Dementia (HCC)    Hypertension    Stroke [facility])     Past Surgical History:  Procedure Laterality Date   ABDOMINAL HYSTERECTOMY     FOOT SURGERY Right    HIP SURGERY Right    REPLACEMENT TOTAL KNEE Right     History reviewed. No pertinent family history.  Social History:  reports that she has quit smoking. Her smoking use included cigarettes. She has never been exposed to tobacco smoke. She has never used smokeless tobacco. No history on file for alcohol use and drug use.  Allergies:  Allergies  Allergen Reactions   Codeine     Medications: I have reviewed the patient's current medications.  Results for orders placed or performed during the hospital encounter of 10/03/22 (from the past 48 hour(s))  CBC     Status: Abnormal   Collection Time: 10/03/22  1:54 PM  Result Value Ref Range   WBC 26.5 (H) 4.0 - 10.5 K/uL   RBC 3.08 (L) 3.87 - 5.11 MIL/uL   Hemoglobin 9.3 (L) 12.0 - 15.0 g/dL   HCT 62.1 (L) 30.8 - 65.7 %   MCV 94.8 80.0 - 100.0 fL   MCH 30.2 26.0 - 34.0 pg   MCHC 31.8 30.0 - 36.0 g/dL   RDW 84.6 (H) 96.2 - 95.2 %   Platelets 178 150 - 400 K/uL   nRBC 0.0 0.0 - 0.2 %    Comment: Performed at [facility], [address] [place], [place] [address]  Comprehensive metabolic panel     Status: Abnormal    Collection Time: 10/03/22  1:54 PM  Result Value Ref Range   Sodium 134 (L) 135 - 145 mmol/L   Potassium 3.9 3.5 - 5.1 mmol/L   Chloride 102 98 - 111 mmol/L   CO2 21 (L) 22 - 32 mmol/L   Glucose, Bld 169 (H) 70 - 99 mg/dL    Comment: Glucose reference range applies only to samples taken after fasting for at least 8 hours.   BUN 46 (H) 8 - 23 mg/dL   Creatinine, Ser 4.40 (H) 0.44 - 1.00 mg/dL   Calcium 8.8 (L) 8.9 - 10.3 mg/dL   Total Protein 6.9 6.5 - 8.1 g/dL   Albumin 3.5 3.5 - 5.0 g/dL   AST 74 (H) 15 - 41 U/L   ALT 25 0 - 44 U/L   Alkaline Phosphatase 83 38 - 126 U/L   Total Bilirubin 0.7 0.3 - 1.2 mg/dL   GFR, Estimated 25 (L) >60 mL/min    Comment: (NOTE) Calculated using the CKD-EPI Creatinine Equation (2021)    Anion gap 11 5 - 15    Comment: Performed at [facility], [address] [place], [place] [address]  CK     Status: Abnormal  Collection Time: 10/03/22  1:54 PM  Result Value Ref Range   Total CK 1,991 (H) 38 - 234 U/L    Comment: Performed at [facility], [address], [place], [place] [address]  Urinalysis, Routine w reflex microscopic -Urine, Clean Catch     Status: Abnormal   Collection Time: 10/03/22  4:39 PM  Result Value Ref Range   Color, Urine YELLOW YELLOW   APPearance HAZY (A) CLEAR   Specific Gravity, Urine 1.017 1.005 - 1.030   pH 5.0 5.0 - 8.0   Glucose, UA NEGATIVE NEGATIVE mg/dL   Hgb urine dipstick MODERATE (A) NEGATIVE   Bilirubin Urine NEGATIVE NEGATIVE   Ketones, ur 5 (A) NEGATIVE mg/dL   Protein, ur 440 (A) NEGATIVE mg/dL   Nitrite NEGATIVE NEGATIVE   Leukocytes,Ua MODERATE (A) NEGATIVE   RBC / HPF 0-5 0 - 5 RBC/hpf   WBC, UA 21-50 0 - 5 WBC/hpf   Bacteria, UA MANY (A) NONE SEEN   Squamous Epithelial / HPF 0-5 0 - 5 /HPF   WBC Clumps PRESENT    Mucus PRESENT     Comment: Performed at [facility], [address], [place], [place] [address]  Comprehensive metabolic panel     Status: Abnormal   Collection Time: 10/04/22  4:36 AM   Result Value Ref Range   Sodium 134 (L) 135 - 145 mmol/L   Potassium 4.0 3.5 - 5.1 mmol/L   Chloride 103 98 - 111 mmol/L   CO2 19 (L) 22 - 32 mmol/L   Glucose, Bld 97 70 - 99 mg/dL    Comment: Glucose reference range applies only to samples taken after fasting for at least 8 hours.   BUN 44 (H) 8 - 23 mg/dL   Creatinine, Ser 5.95 (H) 0.44 - 1.00 mg/dL   Calcium 8.2 (L) 8.9 - 10.3 mg/dL   Total Protein 6.0 (L) 6.5 - 8.1 g/dL   Albumin 2.8 (L) 3.5 - 5.0 g/dL   AST 70 (H) 15 - 41 U/L   ALT 30 0 - 44 U/L   Alkaline Phosphatase 69 38 - 126 U/L   Total Bilirubin 1.0 0.3 - 1.2 mg/dL   GFR, Estimated 33 (L) >60 mL/min    Comment: (NOTE) Calculated using the CKD-EPI Creatinine Equation (2021)    Anion gap 12 5 - 15    Comment: Performed at [facility], [address] [place], [place] [address]  Magnesium     Status: None   Collection Time: 10/04/22  4:36 AM  Result Value Ref Range   Magnesium 2.1 1.7 - 2.4 mg/dL    Comment: Performed at [facility], [address] [place], [place] [address]  CBC with Differential/Platelet     Status: Abnormal   Collection Time: 10/04/22  4:36 AM  Result Value Ref Range   WBC 22.5 (H) 4.0 - 10.5 K/uL   RBC 2.69 (L) 3.87 - 5.11 MIL/uL   Hemoglobin 8.1 (L) 12.0 - 15.0 g/dL   HCT 95.1 (L) 88.4 - 16.6 %   MCV 95.9 80.0 - 100.0 fL   MCH 30.1 26.0 - 34.0 pg   MCHC 31.4 30.0 - 36.0 g/dL   RDW 06.3 (H) 01.6 - 01.0 %   Platelets 168 150 - 400 K/uL   nRBC 0.1 0.0 - 0.2 %   Neutrophils Relative % 82 %   Neutro Abs 18.5 (H) 1.7 - 7.7 K/uL   Lymphocytes Relative 7 %   Lymphs Abs 1.5  0.7 - 4.0 K/uL   Monocytes Relative 10 %   Monocytes Absolute 2.4 (H) 0.1 - 1.0 K/uL   Eosinophils Relative 0 %   Eosinophils Absolute 0.0 0.0 - 0.5 K/uL   Basophils Relative 0 %   Basophils Absolute 0.0 0.0 - 0.1 K/uL   Immature Granulocytes 1 %   Abs Immature Granulocytes 0.16 (H) 0.00 - 0.07 K/uL    Comment: Performed at [facility], [address], [place], [place] [address]  TSH     Status: None   Collection Time: 10/04/22  4:36 AM  Result Value Ref Range   TSH 0.651 0.350 - 4.500 uIU/mL    Comment: Performed by a 3rd Generation assay with a functional sensitivity of <=0.01 uIU/mL. Performed at [facility], [address], [place], [place] [address]   CK     Status: Abnormal   Collection Time: 10/04/22  4:36 AM  Result Value Ref Range   Total CK 1,138 (H) 38 - 234 U/L    Comment: Performed at [facility], [address] [place], [place] [address]    DG Wrist Complete Right  Result Date: 10/03/2022 CLINICAL DATA:  Post reduction EXAM: RIGHT WRIST - COMPLETE 3+ VIEW COMPARISON:  10/03/2022 FINDINGS: Casting material limits bone detail. Bones appear demineralized. Acute comminuted intra-articular distal radius fracture with impaction. Residual dorsal angulation of distal fracture fragment not much changed in the interval. Similar alignment of minimally displaced ulnar styloid fracture IMPRESSION: Similar alignment of distal radius and ulnar styloid fractures. Electronically Signed   By: Jasmine Pang M.D.   On: 10/03/2022 22:49   DG Ribs Unilateral W/Chest Right  Result Date: 10/03/2022 CLINICAL DATA:  Post fall, now with right-sided rib pain. EXAM: RIGHT RIBS AND CHEST - 3+ VIEW COMPARISON:  06/18/2020 FINDINGS: Unchanged enlarged cardiac silhouette and mediastinal contours with atherosclerotic plaque within thoracic aorta. There is persistent thickening the right paratracheal stripe presumably secondary to prominent vasculature. No discrete focal airspace opacities. No pleural effusion or pneumothorax no evidence of edema. No acute osseous abnormalities. Specifically, no definite displaced right-sided rib fractures. Moderate to severe degenerative change of the right glenohumeral joint with joint space loss, subchondral sclerosis and inferiorly directed osteophytosis. IMPRESSION: 1. No acute cardiopulmonary disease. 2. No  definite displaced right-sided rib fractures. 3. Moderate to severe degenerative change of the right glenohumeral joint. Electronically Signed   By: Simonne Come M.D.   On: 10/03/2022 17:07   CT Cervical Spine Wo Contrast  Result Date: 10/03/2022 CLINICAL DATA:  Neck trauma (Age >= 65y) EXAM: CT CERVICAL SPINE WITHOUT CONTRAST TECHNIQUE: Multidetector CT imaging of the cervical spine was performed without intravenous contrast. Multiplanar CT image reconstructions were also generated. RADIATION DOSE REDUCTION: This exam was performed according to the departmental dose-optimization program which includes automated exposure control, adjustment of the mA and/or kV according to patient size and/or use of iterative reconstruction technique. COMPARISON:  CTA neck 07/21/2020 FINDINGS: Alignment: Mild anterolisthesis of C3 on C4, C4 on C5, C5 on C6, and C7 on T1, most likely degenerative given degenerative changes at these levels and given similar alignment on prior CTA neck. Skull base and vertebrae: No acute fracture. No primary bone lesion or focal pathologic process. Soft tissues and spinal canal: No prevertebral fluid or swelling. No visible canal hematoma. Disc levels:  Moderate multilevel degenerative change. Upper chest: Visualized lung apices are clear. IMPRESSION: 1. No evidence of acute fracture or traumatic malalignment. 2. Large (4.5 cm)  right thyroid nodule. Recommend thyroid ultrasound (ref: J Am Coll Radiol. 2015 Feb;12(2): 143-50). Electronically Signed   By: Feliberto Harts M.D.   On: 10/03/2022 16:40   CT Head Wo Contrast  Result Date: 10/03/2022 CLINICAL DATA:  Trauma EXAM: CT HEAD WITHOUT CONTRAST TECHNIQUE: Contiguous axial images were obtained from the base of the skull through the vertex without intravenous contrast. RADIATION DOSE REDUCTION: This exam was performed according to the departmental dose-optimization program which includes automated exposure control, adjustment of the mA and/or  kV according to patient size and/or use of iterative reconstruction technique. COMPARISON:  CT head 07/21/2020.  MRI brain 07/22/2020. FINDINGS: Brain: No evidence of acute infarction, hemorrhage, hydrocephalus, extra-axial collection or mass lesion/mass effect. Again seen is mild diffuse atrophy and mild periventricular white matter hypodensity, likely chronic small vessel ischemic change. Vascular: Atherosclerotic calcifications are present within the cavernous internal carotid arteries. Skull: Normal. Negative for fracture or focal lesion. Sinuses/Orbits: No acute finding. Other: None. IMPRESSION: 1. No acute intracranial abnormality. 2. Mild diffuse atrophy and mild chronic small vessel ischemic change. Electronically Signed   By: Darliss Cheney M.D.   On: 10/03/2022 16:36   DG Pelvis 1-2 Views  Result Date: 10/03/2022 CLINICAL DATA:  Right femur pain after fall today. EXAM: PELVIS - 1-2 VIEW COMPARISON:  None Available. FINDINGS: Status post surgical internal fixation of old unhealed proximal right femoral shaft fracture. No acute fracture or dislocation is noted. IMPRESSION: No acute abnormality seen. Electronically Signed   By: Lupita Raider M.D.   On: 10/03/2022 15:19   DG FEMUR, MIN 2 VIEWS RIGHT  Result Date: 10/03/2022 CLINICAL DATA:  Right femur pain after fall today. EXAM: RIGHT FEMUR 2 VIEWS COMPARISON:  July 21, 2020. FINDINGS: Status post surgical internal fixation of old proximal right femoral shaft fracture. There is now noted severely displaced and angulated fracture of the midshaft of the right femur. Status post right total knee arthroplasty. IMPRESSION: Acute severely displaced and angulated midshaft fracture of the right femur. Electronically Signed   By: Lupita Raider M.D.   On: 10/03/2022 15:15   DG Shoulder Right  Result Date: 10/03/2022 CLINICAL DATA:  Right shoulder pain after fall today. EXAM: RIGHT SHOULDER - 2+ VIEW COMPARISON:  None Available. FINDINGS: There is no  evidence of fracture or dislocation. Mild degenerative changes seen involving right acromioclavicular joint. Moderate degenerative joint disease of right glenohumeral joint is noted. Soft tissues are unremarkable. IMPRESSION: Degenerative changes as noted above.  No acute abnormality seen. Electronically Signed   By: Lupita Raider M.D.   On: 10/03/2022 15:12   DG Hand Complete Right  Result Date: 10/03/2022 CLINICAL DATA:  Right hand pain after fall today. EXAM: RIGHT HAND - COMPLETE 3+ VIEW COMPARISON:  None Available. FINDINGS: Moderately displaced and comminuted distal right radial fracture is noted. Severe degenerative changes seen involving the first carpometacarpal joint. Degenerative changes are seen involving multiple interphalangeal joints. IMPRESSION: Moderately displaced and comminuted distal right radial fracture. Minimally displaced ulnar styloid fracture. Electronically Signed   By: Lupita Raider M.D.   On: 10/03/2022 15:10   DG Wrist Complete Right  Result Date: 10/03/2022 CLINICAL DATA:  Right wrist pain after fall today. EXAM: RIGHT WRIST - COMPLETE 3+ VIEW COMPARISON:  None Available. FINDINGS: Moderately displaced and comminuted distal right radial fracture is noted with intra-articular extension. Minimally displaced ulnar styloid fracture is noted. IMPRESSION: Moderately displaced and comminuted distal right radial fracture with intra-articular extension. Minimally displaced ulnar styloid  fracture. Electronically Signed   By: Lupita Raider M.D.   On: 10/03/2022 15:09    Review of Systems  HENT:  Negative for ear discharge, ear pain, hearing loss and tinnitus.   Eyes:  Negative for photophobia and pain.  Respiratory:  Negative for cough and shortness of breath.   Cardiovascular:  Negative for chest pain.  Gastrointestinal:  Negative for abdominal pain, nausea and vomiting.  Genitourinary:  Negative for dysuria, flank pain, frequency and urgency.  Musculoskeletal:  Positive  for arthralgias (Right leg and wrist). Negative for back pain, myalgias and neck pain.  Neurological:  Negative for dizziness and headaches.  Hematological:  Does not bruise/bleed easily.  Psychiatric/Behavioral:  The patient is not nervous/anxious.    Blood pressure (!) 129/59, pulse 89, temperature 97.6 F (36.4 C), temperature source Oral, resp. rate 19, height 5\' 3"  (1.6 m), weight 81.6 kg, SpO2 95%. Physical Exam Constitutional:      General: She is not in acute distress.    Appearance: She is well-developed. She is not diaphoretic.  HENT:     Head: Normocephalic and atraumatic.  Eyes:     General: No scleral icterus.       Right eye: No discharge.        Left eye: No discharge.     Conjunctiva/sclera: Conjunctivae normal.  Cardiovascular:     Rate and Rhythm: Normal rate and regular rhythm.  Pulmonary:     Effort: Pulmonary effort is normal. No respiratory distress.  Musculoskeletal:     Cervical back: Normal range of motion.     Comments: Right shoulder, elbow, wrist, digits- no skin wounds, volar splint in place, no instability, no blocks to motion  Sens  Ax/M/U intact, R mildly paresthetic  Mot   Ax/ R/ PIN/ M/ AIN/ U grossly intact  Fingers perfused  RLE No traumatic wounds, ecchymosis, or rash  Severe TTP thigh  No knee or ankle effusion  Sens DPN, SPN, TN intact  Motor EHL, ext, flex, evers 5/5  DP 2+, PT 2+, No significant edema  Skin:    General: Skin is warm and dry.  Neurological:     Mental Status: She is alert.  Psychiatric:        Mood and Affect: Mood normal.        Behavior: Behavior normal.     Assessment/Plan: Right wrist fx -- Plan ORIF tomorrow with Dr. Jena Gauss. Right femur fx -- Plan ORIF tomorrow. Please keep NPO after MN.    Freeman Caldron, PA-C Orthopedic Surgery [phone] 10/04/2022, 9:08 AM

## 2022-10-04 NOTE — Hospital Course (Addendum)
Susan Osborne was admitted to the hospital with the working diagnosis of right ulnare styloid fracture.  89 yo female with the past medical history of dementia, arthritis, hypertension, hypothyroidism, hyperlipidemia, and recurrent urinary tract infections who presented after a mechanical fall. Patient was found down on her kitchen floor, landing on her right side. Apparently she had a fall from her own height while ambulating. It is estimated that she spent about 14 to 16 hrs on the floor before she was found. She was recently diagnosed with urinary tract infection as outpatient. She lives alone and uses a walker for ambulation. She required four people to lift from the floor and get into the care.  In the ED, her blood pressure was 109/50, HR 86, RR 18 and 02 saturation 94% on room air, lungs with no wheezing or rales, heart with S1 and S2 present and regular, abdomen with no distention and no lower extremity edema. Right leg shortened compared to the left.   Na 134, K 3,9 Cl 102, bicarbonate 21, glucose 169, BUN 46, cr 1,83  AST 74, ALT 25,  BNP 1,991  CK 1,991 and 1,138  Wbc 26.5 hgb 9.3 plt 178   Urine analysis SG 1,017, protein 100, leukocytes moderate, 21-50 wbc, moderate Hgb, with 0-5 RBC.   CT cervical spine with no evidence of acute fracture or traumatic malalignment. Large 4,5 cm right thyroid nodule. (Recommended thyroid ultrasound).  CT head with no acute intracranial abnormalities, mild diffuse atrophy and mild chronic small vessel ischemic change.   Right femur radiograph with acute severely displaced and angulated midshaft fracture of the right femur.  Right hand radiograph with moderately displaced and comminuted distal right radial fracture.  Minimally displaced ulnar styloid fracture.  Right wrist radiograph with displaced and comminuted distal radial fracture with intra articular extension. Minimally displaced ulnar styloid fracture.   Chest radiograph with no infiltrates or  effusions, no rib fractures or pneumothorax.   EKG 84 bpm, normal axis, qtc 505, left bundle branch block, sinus rhythm with J point elevation V1 to V4, with poor R R wave progression, negative T wave lead II, III, AvF.   Patient was placed on analgesics and IV fluids.  Transferred from [facility] to [facility] for orthopedic procedure.

## 2022-10-04 NOTE — Progress Notes (Signed)
Patient transferred from [facility] and oriented,complain of pain on a pain scale of 7/10 to the right hip,medicated for pain,Bath and CHG done,patient made comfortable in room,family at bedside and will continue to monitor.

## 2022-10-04 NOTE — H&P (View-Only) (Signed)
Reason for Consult:Polytrauma Referring Physician: Delrae Sawyers Arrien Time called: 0845 Time at bedside: 42   Susan [facility]borne is an 89 y.o. female.  HPI: Susan Osborne was in her kitchen and fell. She's unsure what caused the fall. She had immediate right leg pain and could not get up. She lay on the floor for several hours until she was found. She was brought to Susan [facility] where workup showed right wrist and femur fxs. Due to the complexity of the femur fx orthopedic trauma consultation was requested and she was transferred to [facility] for definitive care. She lives at home alone and typically uses a RW or cane for ambulation.  Past Medical History:  Diagnosis Date   Arthritis    Dementia (HCC)    Hypertension    Stroke [facility])     Past Surgical History:  Procedure Laterality Date   ABDOMINAL HYSTERECTOMY     FOOT SURGERY Right    HIP SURGERY Right    REPLACEMENT TOTAL KNEE Right     History reviewed. No pertinent family history.  Social History:  reports that she has quit smoking. Her smoking use included cigarettes. She has never been exposed to tobacco smoke. She has never used smokeless tobacco. No history on file for alcohol use and drug use.  Allergies:  Allergies  Allergen Reactions   Codeine     Medications: I have reviewed the patient's current medications.  Results for orders placed or performed during the [facility] encounter of 10/03/22 (from the past 48 hour(s))  CBC     Status: Abnormal   Collection Time: 10/03/22  1:54 PM  Result Value Ref Range   WBC 26.5 (H) 4.0 - 10.5 K/uL   RBC 3.08 (L) 3.87 - 5.11 MIL/uL   Hemoglobin 9.3 (L) 12.0 - 15.0 g/dL   HCT 62.1 (L) 30.8 - 65.7 %   MCV 94.8 80.0 - 100.0 fL   MCH 30.2 26.0 - 34.0 pg   MCHC 31.8 30.0 - 36.0 g/dL   RDW 84.6 (H) 96.2 - 95.2 %   Platelets 178 150 - 400 K/uL   nRBC 0.0 0.0 - 0.2 %    Comment: Performed at [facility], [address] [place], [place] [address]  Comprehensive metabolic panel     Status: Abnormal    Collection Time: 10/03/22  1:54 PM  Result Value Ref Range   Sodium 134 (L) 135 - 145 mmol/L   Potassium 3.9 3.5 - 5.1 mmol/L   Chloride 102 98 - 111 mmol/L   CO2 21 (L) 22 - 32 mmol/L   Glucose, Bld 169 (H) 70 - 99 mg/dL    Comment: Glucose reference range applies only to samples taken after fasting for at least 8 hours.   BUN 46 (H) 8 - 23 mg/dL   Creatinine, Ser 4.40 (H) 0.44 - 1.00 mg/dL   Calcium 8.8 (L) 8.9 - 10.3 mg/dL   Total Protein 6.9 6.5 - 8.1 g/dL   Albumin 3.5 3.5 - 5.0 g/dL   AST 74 (H) 15 - 41 U/L   ALT 25 0 - 44 U/L   Alkaline Phosphatase 83 38 - 126 U/L   Total Bilirubin 0.7 0.3 - 1.2 mg/dL   GFR, Estimated 25 (L) >60 mL/min    Comment: (NOTE) Calculated using the CKD-EPI Creatinine Equation (2021)    Anion gap 11 5 - 15    Comment: Performed at [facility], [address] [place], [place] [address]  CK     Status: Abnormal  Collection Time: 10/03/22  1:54 PM  Result Value Ref Range   Total CK 1,991 (H) 38 - 234 U/L    Comment: Performed at [facility], [address], [place], [place] [address]  Urinalysis, Routine w reflex microscopic -Urine, Clean Catch     Status: Abnormal   Collection Time: 10/03/22  4:39 PM  Result Value Ref Range   Color, Urine YELLOW YELLOW   APPearance HAZY (A) CLEAR   Specific Gravity, Urine 1.017 1.005 - 1.030   pH 5.0 5.0 - 8.0   Glucose, UA NEGATIVE NEGATIVE mg/dL   Hgb urine dipstick MODERATE (A) NEGATIVE   Bilirubin Urine NEGATIVE NEGATIVE   Ketones, ur 5 (A) NEGATIVE mg/dL   Protein, ur 440 (A) NEGATIVE mg/dL   Nitrite NEGATIVE NEGATIVE   Leukocytes,Ua MODERATE (A) NEGATIVE   RBC / HPF 0-5 0 - 5 RBC/hpf   WBC, UA 21-50 0 - 5 WBC/hpf   Bacteria, UA MANY (A) NONE SEEN   Squamous Epithelial / HPF 0-5 0 - 5 /HPF   WBC Clumps PRESENT    Mucus PRESENT     Comment: Performed at [facility], [address], [place], [place] [address]  Comprehensive metabolic panel     Status: Abnormal   Collection Time: 10/04/22  4:36 AM   Result Value Ref Range   Sodium 134 (L) 135 - 145 mmol/L   Potassium 4.0 3.5 - 5.1 mmol/L   Chloride 103 98 - 111 mmol/L   CO2 19 (L) 22 - 32 mmol/L   Glucose, Bld 97 70 - 99 mg/dL    Comment: Glucose reference range applies only to samples taken after fasting for at least 8 hours.   BUN 44 (H) 8 - 23 mg/dL   Creatinine, Ser 5.95 (H) 0.44 - 1.00 mg/dL   Calcium 8.2 (L) 8.9 - 10.3 mg/dL   Total Protein 6.0 (L) 6.5 - 8.1 g/dL   Albumin 2.8 (L) 3.5 - 5.0 g/dL   AST 70 (H) 15 - 41 U/L   ALT 30 0 - 44 U/L   Alkaline Phosphatase 69 38 - 126 U/L   Total Bilirubin 1.0 0.3 - 1.2 mg/dL   GFR, Estimated 33 (L) >60 mL/min    Comment: (NOTE) Calculated using the CKD-EPI Creatinine Equation (2021)    Anion gap 12 5 - 15    Comment: Performed at [facility], [address] [place], [place] [address]  Magnesium     Status: None   Collection Time: 10/04/22  4:36 AM  Result Value Ref Range   Magnesium 2.1 1.7 - 2.4 mg/dL    Comment: Performed at [facility], [address] [place], [place] [address]  CBC with Differential/Platelet     Status: Abnormal   Collection Time: 10/04/22  4:36 AM  Result Value Ref Range   WBC 22.5 (H) 4.0 - 10.5 K/uL   RBC 2.69 (L) 3.87 - 5.11 MIL/uL   Hemoglobin 8.1 (L) 12.0 - 15.0 g/dL   HCT 95.1 (L) 88.4 - 16.6 %   MCV 95.9 80.0 - 100.0 fL   MCH 30.1 26.0 - 34.0 pg   MCHC 31.4 30.0 - 36.0 g/dL   RDW 06.3 (H) 01.6 - 01.0 %   Platelets 168 150 - 400 K/uL   nRBC 0.1 0.0 - 0.2 %   Neutrophils Relative % 82 %   Neutro Abs 18.5 (H) 1.7 - 7.7 K/uL   Lymphocytes Relative 7 %   Lymphs Abs 1.5  0.7 - 4.0 K/uL   Monocytes Relative 10 %   Monocytes Absolute 2.4 (H) 0.1 - 1.0 K/uL   Eosinophils Relative 0 %   Eosinophils Absolute 0.0 0.0 - 0.5 K/uL   Basophils Relative 0 %   Basophils Absolute 0.0 0.0 - 0.1 K/uL   Immature Granulocytes 1 %   Abs Immature Granulocytes 0.16 (H) 0.00 - 0.07 K/uL    Comment: Performed at [facility], [address], [place], [place] [address]  TSH     Status: None   Collection Time: 10/04/22  4:36 AM  Result Value Ref Range   TSH 0.651 0.350 - 4.500 uIU/mL    Comment: Performed by a 3rd Generation assay with a functional sensitivity of <=0.01 uIU/mL. Performed at [facility], [address], [place], [place] [address]   CK     Status: Abnormal   Collection Time: 10/04/22  4:36 AM  Result Value Ref Range   Total CK 1,138 (H) 38 - 234 U/L    Comment: Performed at [facility], [address] [place], [place] [address]    DG Wrist Complete Right  Result Date: 10/03/2022 CLINICAL DATA:  Post reduction EXAM: RIGHT WRIST - COMPLETE 3+ VIEW COMPARISON:  10/03/2022 FINDINGS: Casting material limits bone detail. Bones appear demineralized. Acute comminuted intra-articular distal radius fracture with impaction. Residual dorsal angulation of distal fracture fragment not much changed in the interval. Similar alignment of minimally displaced ulnar styloid fracture IMPRESSION: Similar alignment of distal radius and ulnar styloid fractures. Electronically Signed   By: Jasmine Pang M.D.   On: 10/03/2022 22:49   DG Ribs Unilateral W/Chest Right  Result Date: 10/03/2022 CLINICAL DATA:  Post fall, now with right-sided rib pain. EXAM: RIGHT RIBS AND CHEST - 3+ VIEW COMPARISON:  06/18/2020 FINDINGS: Unchanged enlarged cardiac silhouette and mediastinal contours with atherosclerotic plaque within thoracic aorta. There is persistent thickening the right paratracheal stripe presumably secondary to prominent vasculature. No discrete focal airspace opacities. No pleural effusion or pneumothorax no evidence of edema. No acute osseous abnormalities. Specifically, no definite displaced right-sided rib fractures. Moderate to severe degenerative change of the right glenohumeral joint with joint space loss, subchondral sclerosis and inferiorly directed osteophytosis. IMPRESSION: 1. No acute cardiopulmonary disease. 2. No  definite displaced right-sided rib fractures. 3. Moderate to severe degenerative change of the right glenohumeral joint. Electronically Signed   By: Simonne Come M.D.   On: 10/03/2022 17:07   CT Cervical Spine Wo Contrast  Result Date: 10/03/2022 CLINICAL DATA:  Neck trauma (Age >= 65y) EXAM: CT CERVICAL SPINE WITHOUT CONTRAST TECHNIQUE: Multidetector CT imaging of the cervical spine was performed without intravenous contrast. Multiplanar CT image reconstructions were also generated. RADIATION DOSE REDUCTION: This exam was performed according to the departmental dose-optimization program which includes automated exposure control, adjustment of the mA and/or kV according to patient size and/or use of iterative reconstruction technique. COMPARISON:  CTA neck 07/21/2020 FINDINGS: Alignment: Mild anterolisthesis of C3 on C4, C4 on C5, C5 on C6, and C7 on T1, most likely degenerative given degenerative changes at these levels and given similar alignment on prior CTA neck. Skull base and vertebrae: No acute fracture. No primary bone lesion or focal pathologic process. Soft tissues and spinal canal: No prevertebral fluid or swelling. No visible canal hematoma. Disc levels:  Moderate multilevel degenerative change. Upper chest: Visualized lung apices are clear. IMPRESSION: 1. No evidence of acute fracture or traumatic malalignment. 2. Large (4.5 cm)  right thyroid nodule. Recommend thyroid ultrasound (ref: J Am Coll Radiol. 2015 Feb;12(2): 143-50). Electronically Signed   By: Feliberto Harts M.D.   On: 10/03/2022 16:40   CT Head Wo Contrast  Result Date: 10/03/2022 CLINICAL DATA:  Trauma EXAM: CT HEAD WITHOUT CONTRAST TECHNIQUE: Contiguous axial images were obtained from the base of the skull through the vertex without intravenous contrast. RADIATION DOSE REDUCTION: This exam was performed according to the departmental dose-optimization program which includes automated exposure control, adjustment of the mA and/or  kV according to patient size and/or use of iterative reconstruction technique. COMPARISON:  CT head 07/21/2020.  MRI brain 07/22/2020. FINDINGS: Brain: No evidence of acute infarction, hemorrhage, hydrocephalus, extra-axial collection or mass lesion/mass effect. Again seen is mild diffuse atrophy and mild periventricular white matter hypodensity, likely chronic small vessel ischemic change. Vascular: Atherosclerotic calcifications are present within the cavernous internal carotid arteries. Skull: Normal. Negative for fracture or focal lesion. Sinuses/Orbits: No acute finding. Other: None. IMPRESSION: 1. No acute intracranial abnormality. 2. Mild diffuse atrophy and mild chronic small vessel ischemic change. Electronically Signed   By: Darliss Cheney M.D.   On: 10/03/2022 16:36   DG Pelvis 1-2 Views  Result Date: 10/03/2022 CLINICAL DATA:  Right femur pain after fall today. EXAM: PELVIS - 1-2 VIEW COMPARISON:  None Available. FINDINGS: Status post surgical internal fixation of old unhealed proximal right femoral shaft fracture. No acute fracture or dislocation is noted. IMPRESSION: No acute abnormality seen. Electronically Signed   By: Lupita Raider M.D.   On: 10/03/2022 15:19   DG FEMUR, MIN 2 VIEWS RIGHT  Result Date: 10/03/2022 CLINICAL DATA:  Right femur pain after fall today. EXAM: RIGHT FEMUR 2 VIEWS COMPARISON:  July 21, 2020. FINDINGS: Status post surgical internal fixation of old proximal right femoral shaft fracture. There is now noted severely displaced and angulated fracture of the midshaft of the right femur. Status post right total knee arthroplasty. IMPRESSION: Acute severely displaced and angulated midshaft fracture of the right femur. Electronically Signed   By: Lupita Raider M.D.   On: 10/03/2022 15:15   DG Shoulder Right  Result Date: 10/03/2022 CLINICAL DATA:  Right shoulder pain after fall today. EXAM: RIGHT SHOULDER - 2+ VIEW COMPARISON:  None Available. FINDINGS: There is no  evidence of fracture or dislocation. Mild degenerative changes seen involving right acromioclavicular joint. Moderate degenerative joint disease of right glenohumeral joint is noted. Soft tissues are unremarkable. IMPRESSION: Degenerative changes as noted above.  No acute abnormality seen. Electronically Signed   By: Lupita Raider M.D.   On: 10/03/2022 15:12   DG Hand Complete Right  Result Date: 10/03/2022 CLINICAL DATA:  Right hand pain after fall today. EXAM: RIGHT HAND - COMPLETE 3+ VIEW COMPARISON:  None Available. FINDINGS: Moderately displaced and comminuted distal right radial fracture is noted. Severe degenerative changes seen involving the first carpometacarpal joint. Degenerative changes are seen involving multiple interphalangeal joints. IMPRESSION: Moderately displaced and comminuted distal right radial fracture. Minimally displaced ulnar styloid fracture. Electronically Signed   By: Lupita Raider M.D.   On: 10/03/2022 15:10   DG Wrist Complete Right  Result Date: 10/03/2022 CLINICAL DATA:  Right wrist pain after fall today. EXAM: RIGHT WRIST - COMPLETE 3+ VIEW COMPARISON:  None Available. FINDINGS: Moderately displaced and comminuted distal right radial fracture is noted with intra-articular extension. Minimally displaced ulnar styloid fracture is noted. IMPRESSION: Moderately displaced and comminuted distal right radial fracture with intra-articular extension. Minimally displaced ulnar styloid  fracture. Electronically Signed   By: Lupita Raider M.D.   On: 10/03/2022 15:09    Review of Systems  HENT:  Negative for ear discharge, ear pain, hearing loss and tinnitus.   Eyes:  Negative for photophobia and pain.  Respiratory:  Negative for cough and shortness of breath.   Cardiovascular:  Negative for chest pain.  Gastrointestinal:  Negative for abdominal pain, nausea and vomiting.  Genitourinary:  Negative for dysuria, flank pain, frequency and urgency.  Musculoskeletal:  Positive  for arthralgias (Right leg and wrist). Negative for back pain, myalgias and neck pain.  Neurological:  Negative for dizziness and headaches.  Hematological:  Does not bruise/bleed easily.  Psychiatric/Behavioral:  The patient is not nervous/anxious.    Blood pressure (!) 129/59, pulse 89, temperature 97.6 F (36.4 C), temperature source Oral, resp. rate 19, height 5\' 3"  (1.6 m), weight 81.6 kg, SpO2 95%. Physical Exam Constitutional:      General: She is not in acute distress.    Appearance: She is well-developed. She is not diaphoretic.  HENT:     Head: Normocephalic and atraumatic.  Eyes:     General: No scleral icterus.       Right eye: No discharge.        Left eye: No discharge.     Conjunctiva/sclera: Conjunctivae normal.  Cardiovascular:     Rate and Rhythm: Normal rate and regular rhythm.  Pulmonary:     Effort: Pulmonary effort is normal. No respiratory distress.  Musculoskeletal:     Cervical back: Normal range of motion.     Comments: Right shoulder, elbow, wrist, digits- no skin wounds, volar splint in place, no instability, no blocks to motion  Sens  Ax/M/U intact, R mildly paresthetic  Mot   Ax/ R/ PIN/ M/ AIN/ U grossly intact  Fingers perfused  RLE No traumatic wounds, ecchymosis, or rash  Severe TTP thigh  No knee or ankle effusion  Sens DPN, SPN, TN intact  Motor EHL, ext, flex, evers 5/5  DP 2+, PT 2+, No significant edema  Skin:    General: Skin is warm and dry.  Neurological:     Mental Status: She is alert.  Psychiatric:        Mood and Affect: Mood normal.        Behavior: Behavior normal.     Assessment/Plan: Right wrist fx -- Plan ORIF tomorrow with Dr. Jena Gauss. Right femur fx -- Plan ORIF tomorrow. Please keep NPO after MN.    Freeman Caldron, PA-C Orthopedic Surgery [phone] 10/04/2022, 9:08 AM

## 2022-10-05 ENCOUNTER — Inpatient Hospital Stay (HOSPITAL_COMMUNITY): Payer: Medicare Other | Admitting: Anesthesiology

## 2022-10-05 ENCOUNTER — Encounter (HOSPITAL_COMMUNITY): Payer: Self-pay | Admitting: Family Medicine

## 2022-10-05 ENCOUNTER — Inpatient Hospital Stay (HOSPITAL_COMMUNITY): Payer: Medicare Other

## 2022-10-05 ENCOUNTER — Other Ambulatory Visit: Payer: Self-pay

## 2022-10-05 ENCOUNTER — Encounter (HOSPITAL_COMMUNITY)
Admission: EM | Disposition: A | Discharge status: Skilled Nursing Facility | Payer: Self-pay | Source: Home / Self Care | Attending: Family Medicine

## 2022-10-05 DIAGNOSIS — S52551A Other extraarticular fracture of lower end of right radius, initial encounter for closed fracture: Secondary | ICD-10-CM

## 2022-10-05 DIAGNOSIS — M9711XA Periprosthetic fracture around internal prosthetic right knee joint, initial encounter: Secondary | ICD-10-CM

## 2022-10-05 DIAGNOSIS — S52511A Displaced fracture of right radial styloid process, initial encounter for closed fracture: Secondary | ICD-10-CM

## 2022-10-05 HISTORY — PX: ORIF FEMUR FRACTURE: SHX2119

## 2022-10-05 HISTORY — PX: ORIF WRIST FRACTURE: SHX2133

## 2022-10-05 LAB — URINE CULTURE: Culture: >=100000 — AB

## 2022-10-05 LAB — CBC WITH DIFFERENTIAL/PLATELET
Abs Immature Granulocytes: 0.18 10*3/uL — ABNORMAL HIGH (ref 0.00–0.07)
Basophils Absolute: 0 10*3/uL (ref 0.0–0.1)
Basophils Relative: 0 %
Eosinophils Absolute: 0.1 10*3/uL (ref 0.0–0.5)
Eosinophils Relative: 1 %
HCT: 22.9 % — ABNORMAL LOW (ref 36.0–46.0)
Hemoglobin: 7 g/dL — ABNORMAL LOW (ref 12.0–15.0)
Immature Granulocytes: 1 %
Lymphocytes Relative: 13 %
Lymphs Abs: 2 10*3/uL (ref 0.7–4.0)
MCH: 28.9 pg (ref 26.0–34.0)
MCHC: 30.6 g/dL (ref 30.0–36.0)
MCV: 94.6 fL (ref 80.0–100.0)
Monocytes Absolute: 1.8 10*3/uL — ABNORMAL HIGH (ref 0.1–1.0)
Monocytes Relative: 12 %
Neutro Abs: 11.2 10*3/uL — ABNORMAL HIGH (ref 1.7–7.7)
Neutrophils Relative %: 73 %
Platelets: 154 10*3/uL (ref 150–400)
RBC: 2.42 MIL/uL — ABNORMAL LOW (ref 3.87–5.11)
RDW: 19 % — ABNORMAL HIGH (ref 11.5–15.5)
WBC: 15.4 10*3/uL — ABNORMAL HIGH (ref 4.0–10.5)
nRBC: 0.1 % (ref 0.0–0.2)

## 2022-10-05 LAB — CK: Total CK: 489 U/L — ABNORMAL HIGH (ref 38–234)

## 2022-10-05 LAB — SURGICAL PCR SCREEN
MRSA, PCR: NEGATIVE
Staphylococcus aureus: POSITIVE — AB

## 2022-10-05 LAB — BASIC METABOLIC PANEL
Anion gap: 9 (ref 5–15)
BUN: 39 mg/dL — ABNORMAL HIGH (ref 8–23)
CO2: 18 mmol/L — ABNORMAL LOW (ref 22–32)
Calcium: 7.7 mg/dL — ABNORMAL LOW (ref 8.9–10.3)
Chloride: 106 mmol/L (ref 98–111)
Creatinine, Ser: 1.18 mg/dL — ABNORMAL HIGH (ref 0.44–1.00)
GFR, Estimated: 43 mL/min — ABNORMAL LOW (ref >60)
Glucose, Bld: 138 mg/dL — ABNORMAL HIGH (ref 70–99)
Potassium: 3.7 mmol/L (ref 3.5–5.1)
Sodium: 133 mmol/L — ABNORMAL LOW (ref 135–145)

## 2022-10-05 LAB — ABO/RH: ABO/RH(D): O POS

## 2022-10-05 LAB — PREPARE RBC (CROSSMATCH)

## 2022-10-05 SURGERY — OPEN REDUCTION INTERNAL FIXATION (ORIF) WRIST FRACTURE
Anesthesia: Regional | Site: Wrist | Laterality: Right

## 2022-10-05 MED ORDER — CHLORHEXIDINE GLUCONATE 0.12 % MT SOLN
OROMUCOSAL | Status: AC
  Administered 2022-10-05: 15 mL via OROMUCOSAL
  Filled 2022-10-05: qty 15

## 2022-10-05 MED ORDER — ACETAMINOPHEN 325 MG PO TABS
650.0000 mg | ORAL_TABLET | Freq: Four times a day (QID) | ORAL | Status: DC
  Administered 2022-10-05 – 2022-10-10 (×20): 650 mg via ORAL
  Filled 2022-10-05 (×21): qty 2

## 2022-10-05 MED ORDER — VANCOMYCIN HCL 1000 MG IV SOLR
INTRAVENOUS | Status: AC
  Filled 2022-10-05: qty 40

## 2022-10-05 MED ORDER — FENTANYL CITRATE (PF) 250 MCG/5ML IJ SOLN
INTRAMUSCULAR | Status: AC
  Filled 2022-10-05: qty 5

## 2022-10-05 MED ORDER — LIDOCAINE 2% (20 MG/ML) 5 ML SYRINGE
INTRAMUSCULAR | Status: DC | PRN
  Administered 2022-10-05: 60 mg via INTRAVENOUS

## 2022-10-05 MED ORDER — ROPIVACAINE HCL 5 MG/ML IJ SOLN
INTRAMUSCULAR | Status: DC | PRN
Start: 2022-10-05 — End: 2022-10-05
  Administered 2022-10-05: 40 mL via PERINEURAL

## 2022-10-05 MED ORDER — CHLORHEXIDINE GLUCONATE 0.12 % MT SOLN: 15.0000 mL | Freq: Once | OROMUCOSAL | Status: AC

## 2022-10-05 MED ORDER — PHENYLEPHRINE 80 MCG/ML (10ML) SYRINGE FOR IV PUSH (FOR BLOOD PRESSURE SUPPORT)
PREFILLED_SYRINGE | INTRAVENOUS | Status: AC
  Filled 2022-10-05: qty 10

## 2022-10-05 MED ORDER — VANCOMYCIN HCL 1000 MG IV SOLR
INTRAVENOUS | Status: DC | PRN
  Administered 2022-10-05 (×2): 1000 mg

## 2022-10-05 MED ORDER — KETOROLAC TROMETHAMINE 30 MG/ML IJ SOLN
INTRAMUSCULAR | Status: AC
  Filled 2022-10-05: qty 1

## 2022-10-05 MED ORDER — ENOXAPARIN SODIUM 40 MG/0.4ML IJ SOSY
40.0000 mg | PREFILLED_SYRINGE | INTRAMUSCULAR | Status: DC
  Administered 2022-10-06 – 2022-10-10 (×5): 40 mg via SUBCUTANEOUS
  Filled 2022-10-05 (×5): qty 0.4

## 2022-10-05 MED ORDER — VITAMIN D (ERGOCALCIFEROL) 1.25 MG (50000 UNIT) PO CAPS
50000.0000 [IU] | ORAL_CAPSULE | ORAL | Status: DC
  Administered 2022-10-05: 50000 [IU] via ORAL
  Filled 2022-10-05: qty 1

## 2022-10-05 MED ORDER — ROCURONIUM BROMIDE 10 MG/ML (PF) SYRINGE
PREFILLED_SYRINGE | INTRAVENOUS | Status: AC
  Filled 2022-10-05: qty 10

## 2022-10-05 MED ORDER — FENTANYL CITRATE PF 50 MCG/ML IJ SOSY
50.0000 ug | PREFILLED_SYRINGE | Freq: Once | INTRAMUSCULAR | Status: AC
  Administered 2022-10-05: 50 ug via INTRAVENOUS
  Filled 2022-10-05: qty 1

## 2022-10-05 MED ORDER — ORAL CARE MOUTH RINSE: 15.0000 mL | Freq: Once | OROMUCOSAL | Status: AC

## 2022-10-05 MED ORDER — PROPOFOL 10 MG/ML IV BOLUS
INTRAVENOUS | Status: DC | PRN
  Administered 2022-10-05: 130 mg via INTRAVENOUS

## 2022-10-05 MED ORDER — METOCLOPRAMIDE HCL 5 MG/ML IJ SOLN: 5.0000 mg | Freq: Three times a day (TID) | INTRAMUSCULAR | Status: DC | PRN

## 2022-10-05 MED ORDER — CEFAZOLIN SODIUM-DEXTROSE 2-4 GM/100ML-% IV SOLN
2.0000 g | INTRAVENOUS | Status: AC
  Administered 2022-10-05: 2 g via INTRAVENOUS

## 2022-10-05 MED ORDER — TRANEXAMIC ACID-NACL 1000-0.7 MG/100ML-% IV SOLN
1000.0000 mg | Freq: Once | INTRAVENOUS | Status: AC
  Filled 2022-10-05: qty 100

## 2022-10-05 MED ORDER — CHLORHEXIDINE GLUCONATE CLOTH 2 % EX PADS
6.0000 | MEDICATED_PAD | Freq: Every day | CUTANEOUS | Status: AC
  Administered 2022-10-05 – 2022-10-07 (×3): 6 via TOPICAL

## 2022-10-05 MED ORDER — ONDANSETRON HCL 4 MG/2ML IJ SOLN
INTRAMUSCULAR | Status: DC | PRN
  Administered 2022-10-05: 4 mg via INTRAVENOUS

## 2022-10-05 MED ORDER — DEXAMETHASONE SODIUM PHOSPHATE 10 MG/ML IJ SOLN
INTRAMUSCULAR | Status: AC
  Filled 2022-10-05: qty 1

## 2022-10-05 MED ORDER — 0.9 % SODIUM CHLORIDE (POUR BTL) OPTIME
TOPICAL | Status: DC | PRN
  Administered 2022-10-05: 1000 mL

## 2022-10-05 MED ORDER — LIDOCAINE 2% (20 MG/ML) 5 ML SYRINGE
INTRAMUSCULAR | Status: AC
  Filled 2022-10-05: qty 10

## 2022-10-05 MED ORDER — LEVETIRACETAM 250 MG PO TABS
250.0000 mg | ORAL_TABLET | Freq: Two times a day (BID) | ORAL | Status: DC
  Administered 2022-10-05 – 2022-10-10 (×10): 250 mg via ORAL
  Filled 2022-10-05 (×11): qty 1

## 2022-10-05 MED ORDER — CEFAZOLIN SODIUM-DEXTROSE 2-4 GM/100ML-% IV SOLN
INTRAVENOUS | Status: AC
  Filled 2022-10-05: qty 100

## 2022-10-05 MED ORDER — ACETAMINOPHEN 650 MG RE SUPP
650.0000 mg | Freq: Four times a day (QID) | RECTAL | Status: DC
  Filled 2022-10-05: qty 1

## 2022-10-05 MED ORDER — PHENYLEPHRINE HCL-NACL 20-0.9 MG/250ML-% IV SOLN
INTRAVENOUS | Status: DC | PRN
  Administered 2022-10-05: 25 ug/min via INTRAVENOUS

## 2022-10-05 MED ORDER — DEXAMETHASONE SODIUM PHOSPHATE 10 MG/ML IJ SOLN
INTRAMUSCULAR | Status: DC | PRN
  Administered 2022-10-05: 10 mg

## 2022-10-05 MED ORDER — LACTATED RINGERS IV SOLN: INTRAVENOUS | Status: DC

## 2022-10-05 MED ORDER — ONDANSETRON HCL 4 MG/2ML IJ SOLN
INTRAMUSCULAR | Status: AC
  Filled 2022-10-05: qty 2

## 2022-10-05 MED ORDER — TRANEXAMIC ACID-NACL 1000-0.7 MG/100ML-% IV SOLN
1000.0000 mg | INTRAVENOUS | Status: AC
  Administered 2022-10-05: 1000 mg via INTRAVENOUS

## 2022-10-05 MED ORDER — FENTANYL CITRATE (PF) 250 MCG/5ML IJ SOLN
INTRAMUSCULAR | Status: DC | PRN
  Administered 2022-10-05: 50 ug via INTRAVENOUS

## 2022-10-05 MED ORDER — TRANEXAMIC ACID-NACL 1000-0.7 MG/100ML-% IV SOLN
INTRAVENOUS | Status: AC
  Administered 2022-10-05: 1000 mg via INTRAVENOUS
  Filled 2022-10-05: qty 100

## 2022-10-05 MED ORDER — POLYETHYLENE GLYCOL 3350 17 G PO PACK
17.0000 g | PACK | Freq: Every day | ORAL | Status: DC | PRN
  Administered 2022-10-09: 17 g via ORAL
  Filled 2022-10-05: qty 1

## 2022-10-05 MED ORDER — FENTANYL CITRATE (PF) 100 MCG/2ML IJ SOLN
INTRAMUSCULAR | Status: AC
  Filled 2022-10-05: qty 2

## 2022-10-05 MED ORDER — POVIDONE-IODINE 10 % EX SWAB
2.0000 | Freq: Once | CUTANEOUS | Status: AC
  Administered 2022-10-05: 2 via TOPICAL

## 2022-10-05 MED ORDER — DOCUSATE SODIUM 100 MG PO CAPS
100.0000 mg | ORAL_CAPSULE | Freq: Two times a day (BID) | ORAL | Status: DC
  Administered 2022-10-05 – 2022-10-10 (×10): 100 mg via ORAL
  Filled 2022-10-05 (×10): qty 1

## 2022-10-05 MED ORDER — METOCLOPRAMIDE HCL 5 MG PO TABS: 5.0000 mg | ORAL_TABLET | Freq: Three times a day (TID) | ORAL | Status: DC | PRN

## 2022-10-05 MED ORDER — LACTATED RINGERS IV SOLN: INTRAVENOUS | Status: DC | PRN

## 2022-10-05 MED ORDER — MUPIROCIN 2 % EX OINT
1.0000 | TOPICAL_OINTMENT | Freq: Two times a day (BID) | CUTANEOUS | Status: AC
  Administered 2022-10-05 – 2022-10-09 (×10): 1 via NASAL
  Filled 2022-10-05: qty 22

## 2022-10-05 MED ORDER — CHLORHEXIDINE GLUCONATE 4 % EX SOLN: 60.0000 mL | Freq: Once | CUTANEOUS | Status: DC

## 2022-10-05 MED ORDER — ROCURONIUM BROMIDE 10 MG/ML (PF) SYRINGE
PREFILLED_SYRINGE | INTRAVENOUS | Status: DC | PRN
  Administered 2022-10-05: 60 mg via INTRAVENOUS

## 2022-10-05 MED ORDER — SODIUM CHLORIDE 0.9 % IV SOLN: 10.0000 mL/h | Freq: Once | INTRAVENOUS | Status: DC

## 2022-10-05 MED ORDER — DEXMEDETOMIDINE HCL IN NACL 80 MCG/20ML IV SOLN
INTRAVENOUS | Status: AC
  Filled 2022-10-05: qty 20

## 2022-10-05 SURGICAL SUPPLY — 126 items
ADH SKN CLS APL DERMABOND .7 (GAUZE/BANDAGES/DRESSINGS) ×4
APL PRP STRL LF DISP 70% ISPRP (MISCELLANEOUS)
APL SKNCLS STERI-STRIP NONHPOA (GAUZE/BANDAGES/DRESSINGS) ×2
BAG COUNTER SPONGE SURGICOUNT (BAG) ×2 IMPLANT
BAG SPNG CNTER NS LX DISP (BAG) ×2
BENZOIN TINCTURE PRP APPL 2/3 (GAUZE/BANDAGES/DRESSINGS) IMPLANT
BIT DRILL 100X2XQC STRL (BIT) IMPLANT
BIT DRILL 4.3 (BIT) ×2
BIT DRILL 4.3X300MM (BIT) IMPLANT
BIT DRILL CALIBRATED 1.8MM (BIT) IMPLANT
BIT DRILL LONG 3.3 (BIT) IMPLANT
BIT DRILL QC 2.0X100 (BIT) ×2
BIT DRILL QC 3.3X195 (BIT) IMPLANT
BIT DRL 100X2XQC STRL (BIT) ×2
BLADE CLIPPER SURG (BLADE) IMPLANT
BNDG CMPR 5X3 KNIT ELC UNQ LF (GAUZE/BANDAGES/DRESSINGS) ×2
BNDG CMPR 5X4 KNIT ELC UNQ LF (GAUZE/BANDAGES/DRESSINGS) ×2
BNDG CMPR 5X6 CHSV STRCH STRL (GAUZE/BANDAGES/DRESSINGS)
BNDG CMPR 6 X 5 YARDS HK CLSR (GAUZE/BANDAGES/DRESSINGS) ×2
BNDG CMPR 9X4 STRL LF SNTH (GAUZE/BANDAGES/DRESSINGS)
BNDG CMPR MED 10X6 ELC LF (GAUZE/BANDAGES/DRESSINGS) ×2
BNDG COHESIVE 6X5 TAN ST LF (GAUZE/BANDAGES/DRESSINGS) ×2 IMPLANT
BNDG ELASTIC 3INX 5YD STR LF (GAUZE/BANDAGES/DRESSINGS) ×2 IMPLANT
BNDG ELASTIC 4INX 5YD STR LF (GAUZE/BANDAGES/DRESSINGS) IMPLANT
BNDG ELASTIC 4X5.8 VLCR STR LF (GAUZE/BANDAGES/DRESSINGS) ×2 IMPLANT
BNDG ELASTIC 6INX 5YD STR LF (GAUZE/BANDAGES/DRESSINGS) IMPLANT
BNDG ELASTIC 6X10 VLCR STRL LF (GAUZE/BANDAGES/DRESSINGS) ×2 IMPLANT
BNDG ESMARK 4X9 LF (GAUZE/BANDAGES/DRESSINGS) ×2 IMPLANT
BNDG GAUZE DERMACEA FLUFF 4 (GAUZE/BANDAGES/DRESSINGS) ×2 IMPLANT
BNDG GZE DERMACEA 4 6PLY (GAUZE/BANDAGES/DRESSINGS)
BRUSH SCRUB EZ PLAIN DRY (MISCELLANEOUS) ×4 IMPLANT
CABLE CERLAGE W/CRIMP 1.8 (Cable) IMPLANT
CANISTER SUCT 3000ML PPV (MISCELLANEOUS) ×2 IMPLANT
CAP LOCK NCB (Cap) IMPLANT
CHLORAPREP W/TINT 26 (MISCELLANEOUS) ×2 IMPLANT
CLSR STERI-STRIP ANTIMIC 1/2X4 (GAUZE/BANDAGES/DRESSINGS) IMPLANT
COVER SURGICAL LIGHT HANDLE (MISCELLANEOUS) ×2 IMPLANT
CUFF TOURN SGL QUICK 18X4 (TOURNIQUET CUFF) IMPLANT
CUFF TOURN SGL QUICK 24 (TOURNIQUET CUFF)
CUFF TRNQT CYL 24X4X16.5-23 (TOURNIQUET CUFF) IMPLANT
DERMABOND ADVANCED .7 DNX12 (GAUZE/BANDAGES/DRESSINGS) IMPLANT
DRAPE C-ARM 35X43 STRL (DRAPES) IMPLANT
DRAPE C-ARM 42X72 X-RAY (DRAPES) ×2 IMPLANT
DRAPE C-ARMOR (DRAPES) ×2 IMPLANT
DRAPE HALF SHEET 40X57 (DRAPES) ×4 IMPLANT
DRAPE ORTHO SPLIT 77X108 STRL (DRAPES) ×4
DRAPE SURG 17X23 STRL (DRAPES) ×2 IMPLANT
DRAPE SURG ORHT 6 SPLT 77X108 (DRAPES) ×4 IMPLANT
DRAPE U-SHAPE 47X51 STRL (DRAPES) ×2 IMPLANT
DRESSING MEPILEX FLEX 4X4 (GAUZE/BANDAGES/DRESSINGS) IMPLANT
DRILL CALIBRATED 1.8MM (BIT) ×2
DRSG ADAPTIC 3X8 NADH LF (GAUZE/BANDAGES/DRESSINGS) IMPLANT
DRSG MEPILEX FLEX 4X4 (GAUZE/BANDAGES/DRESSINGS) ×2
DRSG MEPILEX POST OP 4X12 (GAUZE/BANDAGES/DRESSINGS) IMPLANT
DRSG MEPILEX POST OP 4X8 (GAUZE/BANDAGES/DRESSINGS) IMPLANT
ELECT REM PT RETURN 9FT ADLT (ELECTROSURGICAL) ×2
ELECTRODE REM PT RTRN 9FT ADLT (ELECTROSURGICAL) ×2 IMPLANT
GAUZE PAD ABD 8X10 STRL (GAUZE/BANDAGES/DRESSINGS) ×6 IMPLANT
GAUZE SPONGE 4X4 12PLY STRL (GAUZE/BANDAGES/DRESSINGS) ×2 IMPLANT
GAUZE XEROFORM 1X8 LF (GAUZE/BANDAGES/DRESSINGS) ×2 IMPLANT
GLOVE BIO SURGEON STRL SZ 6.5 (GLOVE) ×6 IMPLANT
GLOVE BIO SURGEON STRL SZ7.5 (GLOVE) ×8 IMPLANT
GLOVE BIOGEL PI IND STRL 6.5 (GLOVE) ×2 IMPLANT
GLOVE BIOGEL PI IND STRL 7.0 (GLOVE) ×2 IMPLANT
GLOVE BIOGEL PI IND STRL 7.5 (GLOVE) ×2 IMPLANT
GOWN STRL REUS W/ TWL LRG LVL3 (GOWN DISPOSABLE) ×6 IMPLANT
GOWN STRL REUS W/TWL LRG LVL3 (GOWN DISPOSABLE) ×6
K-WIRE 1.25 TRCR POINT 150 (WIRE) ×2
K-WIRE 2.0 (WIRE) ×2
K-WIRE FXSTD 280X2XNS SS (WIRE) ×2
KIT BASIN OR (CUSTOM PROCEDURE TRAY) ×2 IMPLANT
KIT TURNOVER KIT B (KITS) ×2 IMPLANT
KWIRE 1.25 TRCR POINT 150 (WIRE) IMPLANT
KWIRE FXSTD 280X2XNS SS (WIRE) IMPLANT
MANIFOLD NEPTUNE II (INSTRUMENTS) ×2 IMPLANT
NDL 22X1.5 STRL (OR ONLY) (MISCELLANEOUS) IMPLANT
NEEDLE 22X1.5 STRL (OR ONLY) (MISCELLANEOUS) IMPLANT
NS IRRIG 1000ML POUR BTL (IV SOLUTION) ×2 IMPLANT
PACK ORTHO EXTREMITY (CUSTOM PROCEDURE TRAY) ×2 IMPLANT
PACK TOTAL JOINT (CUSTOM PROCEDURE TRAY) ×2 IMPLANT
PAD ARMBOARD 7.5X6 YLW CONV (MISCELLANEOUS) ×4 IMPLANT
PAD CAST 3X4 CTTN HI CHSV (CAST SUPPLIES) IMPLANT
PAD CAST 4YDX4 CTTN HI CHSV (CAST SUPPLIES) ×2 IMPLANT
PADDING CAST COTTON 3X4 STRL (CAST SUPPLIES) ×2
PADDING CAST COTTON 4X4 STRL (CAST SUPPLIES) ×2
PADDING CAST COTTON 6X4 STRL (CAST SUPPLIES) ×2 IMPLANT
PLATE DISTAL FEMUR 15H 317M RT (Plate) IMPLANT
PLATE VOLAR RT 3H/2CP-VA DRP (Plate) IMPLANT
SCREW 5.0 70MM (Screw) IMPLANT
SCREW 5.0 80MM (Screw) IMPLANT
SCREW CORTEX 2.4X12MM (Screw) IMPLANT
SCREW CORTEX SLFTPNG 24MM 2.4 (Screw) IMPLANT
SCREW LOCK T8 20X2.4XSTVA (Screw) IMPLANT
SCREW LOCKING 2.4X20 (Screw) ×4 IMPLANT
SCREW LOCKING 2.4X22 (Screw) IMPLANT
SCREW NCB 3.5X75X5X6.2XST (Screw) IMPLANT
SCREW NCB 4.0MX42M (Screw) IMPLANT
SCREW NCB 4.0MX50M (Screw) IMPLANT
SCREW NCB 4.0MX55M (Screw) IMPLANT
SCREW NCB 4.0X40MM (Screw) IMPLANT
SCREW NCB 5.0X75MM (Screw) ×6 IMPLANT
SCREW SELF TAP 14MM (Screw) IMPLANT
SCREW UNICORTICAL NCB 5.0X20 (Screw) IMPLANT
SCREW VA LOCKING 2.4X18 (Screw) IMPLANT
SPIKE FLUID TRANSFER (MISCELLANEOUS) ×2 IMPLANT
SPLINT PLASTER CAST FAST 3X15 (CAST SUPPLIES) IMPLANT
SPLINT PLASTER CAST XFAST 3X15 (CAST SUPPLIES) IMPLANT
SPONGE T-LAP 18X18 ~~LOC~~+RFID (SPONGE) IMPLANT
STAPLER VISISTAT 35W (STAPLE) ×2 IMPLANT
SUCTION TUBE FRAZIER 10FR DISP (SUCTIONS) ×2 IMPLANT
SUT ETHILON 3 0 PS 1 (SUTURE) ×4 IMPLANT
SUT MNCRL+ AB 3-0 CT1 36 (SUTURE) IMPLANT
SUT MON AB 2-0 CT1 36 (SUTURE) IMPLANT
SUT VIC AB 0 CT1 27 (SUTURE)
SUT VIC AB 0 CT1 27XBRD ANBCTR (SUTURE) IMPLANT
SUT VIC AB 1 CT1 27 (SUTURE)
SUT VIC AB 1 CT1 27XBRD ANBCTR (SUTURE) IMPLANT
SUT VIC AB 2-0 CT1 27 (SUTURE) ×4
SUT VIC AB 2-0 CT1 TAPERPNT 27 (SUTURE) ×4 IMPLANT
SYR CONTROL 10ML LL (SYRINGE) ×2 IMPLANT
TOWEL GREEN STERILE (TOWEL DISPOSABLE) ×4 IMPLANT
TOWEL GREEN STERILE FF (TOWEL DISPOSABLE) ×2 IMPLANT
TRAY FOLEY MTR SLVR 16FR STAT (SET/KITS/TRAYS/PACK) IMPLANT
TUBE CONNECTING 12X1/4 (SUCTIONS) ×2 IMPLANT
WATER STERILE IRR 1000ML POUR (IV SOLUTION) ×4 IMPLANT
YANKAUER SUCT BULB TIP NO VENT (SUCTIONS) IMPLANT

## 2022-10-05 NOTE — Plan of Care (Signed)
  Problem: Education: Goal: Knowledge of General Education information will improve Description: Including pain rating scale, medication(s)/side effects and non-pharmacologic comfort measures Outcome: Progressing   Problem: Health Behavior/Discharge Planning: Goal: Ability to manage health-related needs will improve Outcome: Progressing   Problem: Clinical Measurements: Goal: Ability to maintain clinical measurements within normal limits will improve Outcome: Progressing Goal: Will remain free from infection Outcome: Progressing Goal: Diagnostic test results will improve Outcome: Progressing Goal: Respiratory complications will improve Outcome: Progressing Goal: Cardiovascular complication will be avoided Outcome: Progressing   Problem: Nutrition: Goal: Adequate nutrition will be maintained Outcome: Progressing   Problem: Safety: Goal: Ability to remain free from injury will improve Outcome: Progressing   Problem: Self-Concept: Goal: Ability to maintain and perform role responsibilities to the fullest extent possible will improve Outcome: Progressing   Problem: Pain Management: Goal: Pain level will decrease Outcome: Progressing

## 2022-10-05 NOTE — Anesthesia Postprocedure Evaluation (Signed)
Anesthesia Post Note  Patient: Delcia Wolk  Procedure(s) Performed: OPEN REDUCTION INTERNAL FIXATION (ORIF) WRIST FRACTURE (Right: Wrist) OPEN REDUCTION INTERNAL FIXATION (ORIF) DISTAL FEMUR FRACTURE (Right: Leg Upper)     Patient location during evaluation: PACU Anesthesia Type: Regional and General Level of consciousness: awake and alert Pain management: pain level controlled Vital Signs Assessment: post-procedure vital signs reviewed and stable Respiratory status: spontaneous breathing, nonlabored ventilation, respiratory function stable and patient connected to nasal cannula oxygen Cardiovascular status: blood pressure returned to baseline and stable Postop Assessment: no apparent nausea or vomiting Anesthetic complications: no   No notable events documented.  Last Vitals:  Vitals:   10/05/22 1425 10/05/22 1440  BP: (!) 120/56 (!) 120/55  Pulse: 78 75  Resp: 17 15  Temp:  36.7 C  SpO2: 93% 95%    Last Pain:  Vitals:   10/05/22 1410  TempSrc:   PainSc: 0-No pain                 [place]

## 2022-10-05 NOTE — Anesthesia Preprocedure Evaluation (Addendum)
Anesthesia Evaluation  Patient identified by MRN, date of birth, ID band Patient awake    Reviewed: Allergy & Precautions, NPO status , Patient's Chart, lab work & pertinent test results  Airway Mallampati: III  TM Distance: >3 FB Neck ROM: Full    Dental  (+) Edentulous Upper, Edentulous Lower   Pulmonary former smoker   Pulmonary exam normal breath sounds clear to auscultation       Cardiovascular hypertension (138/55 preop), Pt. on medications Normal cardiovascular exam Rhythm:Regular Rate:Normal     Neuro/Psych  PSYCHIATRIC DISORDERS     Dementia CVA, No Residual Symptoms    GI/Hepatic negative GI ROS, Neg liver ROS,,,  Endo/Other  Hypothyroidism    Renal/GU Renal InsufficiencyRenal diseaseCr 1.18 trending down, likely 2/2 rhabdo (found down)  negative genitourinary   Musculoskeletal  (+) Arthritis , Osteoarthritis,  R wrist and R distal femur fx   Abdominal  (+) + obese  Peds  Hematology  (+) Blood dyscrasia, anemia Hb 7, plt 154   Anesthesia Other Findings   Reproductive/Obstetrics negative OB ROS                             Anesthesia Physical Anesthesia Plan  ASA: 3  Anesthesia Plan: General and Regional   Post-op Pain Management: Ofirmev IV (intra-op)*   Induction: Intravenous  PONV Risk Score and Plan: Treatment may vary due to age or medical condition and Ondansetron  Airway Management Planned: Oral ETT  Additional Equipment: None  Intra-op Plan:   Post-operative Plan: Extubation in OR  Informed Consent: I have reviewed the patients History and Physical, chart, labs and discussed the procedure including the risks, benefits and alternatives for the proposed anesthesia with the patient or authorized representative who has indicated his/her understanding and acceptance.     Dental advisory given and Consent reviewed with POA  Plan Discussed with: CRNA  Anesthesia  Plan Comments: (Cross x 2, will request 1 unit transfused prior to OR  Full code)       Anesthesia Quick Evaluation

## 2022-10-05 NOTE — Progress Notes (Signed)
PROGRESS NOTE    Susan Osborne  ION:[id number] DOB: February 16, 1927 DOA: 10/03/2022 PCP: Theodoro Kos, MD   Brief Narrative: This 89 yo female with the past medical history of dementia, arthritis, hypertension, hypothyroidism, hyperlipidemia, and recurrent urinary tract infections who presented after a mechanical fall. Patient was found down on her kitchen floor, landing on her right side. Apparently she had a fall from her own height while ambulating. It is estimated that she spent about 14 to 16 hrs on the floor before she was found. She was recently diagnosed with urinary tract infection as outpatient. She lives alone and uses a walker for ambulation. She required four people to lift from the floor and get into the care.  In the ED,  Her Right leg  was shortened compared to the left.  Right femur radiograph with acute severely displaced and angulated midshaft fracture of the right femur.  Right hand radiograph with moderately displaced and comminuted distal right radial fracture.  Minimally displaced ulnar styloid fracture.  Patient was admitted for further evaluation. Orthopedics was consulted.   Patient is scheduled to have ORIF today.  Assessment & Plan:   Active Problems:   Closed fracture of shaft of femur (HCC)   Closed right radial fracture   AKI (acute kidney injury) (HCC)   HTN (hypertension)   Hyperlipidemia   Dementia (HCC)   Hypothyroidism   UTI (urinary tract infection)  Closed fracture of shaft of femur [facility]): Traumatic fracture: Plan is to continue supportive medical therapy with analgesics, and DVT prophylaxis. Add PPI for gi prophylaxis.  Orthopedics consulted. Patient is scheduled for ORIF today. PT and OT evaluation   Closed right radial fracture: Right wrist fracture. Continue immobilization and pain control. Orthopedics consulted and patient scheduled for ORIF today.   AKI (acute kidney injury) (HCC) Rhabdomyolysis. Hyponatremia.  CK is trending down.   Renal functions are improving. Na 134  Continue IV fluids with isotonic saline at 100 cc pr her. Follow up renal functions and electrolytes in am.  Follow up CK(it is trending down).    HTN (hypertension): Blood pressure control with amlodipine.   Hyperlipidemia: Hold on atorvastatin for now considering elevated CK.  Possible resumption at the time of discharge.    Dementia [facility]) She has been living alone with supervision of her family. She may need further assistance at the time of her discharge.  No agitation. She has high risk of developing delirium. Continue with donepezil, buspirone, and venlafaxine.    Hypothyroidism Continue with levothyroxine.  Follow up thyroid nodule as outpatient.    UTI (urinary tract infection) Urinary tract infection present on admission with no sepsis.  Plan to continue antibiotic therapy with ceftriaxone and follow up urine cultures.  Wbc is 22,5 with 18,5 PMN.    DVT prophylaxis:  Code Status: Full code Family Communication:No family at bed side. Disposition Plan:   Status is: Inpatient Remains inpatient appropriate because: Admitted s/p mechanical fall with rhabdomyolysis, AKI, right radius fracture and right femur fracture.  Ortho was consulted and scheduled for ORIF today    Consultants:  Orthopaedics  Procedures: ORIF   Antimicrobials:  Anti-infectives (From admission, onward)    Start     Dose/Rate Route Frequency Ordered Stop   10/05/22 1233  vancomycin (VANCOCIN) powder  Status:  Discontinued          As needed 10/05/22 1233 10/05/22 1404   10/05/22 0930  ceFAZolin (ANCEF) IVPB 2g/100 mL premix        2  g 200 mL/hr over 30 Minutes Intravenous On call to O.R. 10/05/22 5784 10/05/22 1232   10/05/22 0840  ceFAZolin (ANCEF) 2-4 GM/100ML-% IVPB       Note to Pharmacy: Darrick Huntsman: cabinet override      10/05/22 0840 10/05/22 1218   10/04/22 1100  [MAR Hold]  cefTRIAXone (ROCEPHIN) 1 g in sodium chloride 0.9 % 100 mL  IVPB        (MAR Hold since Wed 10/05/2022 at 0840.Hold Reason: Transfer to a Procedural area)   1 g 200 mL/hr over 30 Minutes Intravenous Every 24 hours 10/03/22 2012     10/03/22 2015  cefTRIAXone (ROCEPHIN) 1 g in sodium chloride 0.9 % 100 mL IVPB  Status:  Discontinued        1 g 200 mL/hr over 30 Minutes Intravenous Every 24 hours 10/03/22 2011 10/03/22 2012   10/03/22 1715  cefTRIAXone (ROCEPHIN) 2 g in sodium chloride 0.9 % 100 mL IVPB        2 g 200 mL/hr over 30 Minutes Intravenous  Once 10/03/22 1714 10/03/22 1753      Subjective: Patient was seen and examined at bedside. Overnight events noted.   Patient is s/p ORIF .  Remains in a lot of pain. RUE in cast  Objective: Vitals:   10/05/22 0954 10/05/22 1045 10/05/22 1410 10/05/22 1425  BP:  (!) 140/62 126/60 (!) 120/56  Pulse:  70 84 78  Resp:  19 20 17   Temp:  99 F (37.2 C) 99.6 F (37.6 C)   TempSrc:  Oral    SpO2:  98% 98% 93%  Weight: 81.6 kg     Height: 5' 2.99" (1.6 m)       Intake/Output Summary (Last 24 hours) at 10/05/2022 1433 Last data filed at 10/05/2022 1208 Gross per 24 hour  Intake 641.44 ml  Output 1250 ml  Net -608.56 ml   Filed Weights   10/03/22 1247 10/05/22 0954  Weight: 81.6 kg 81.6 kg   Examination:  General exam: Appears calm and comfortable, deconditioned, not in any acute distress. Respiratory system: CTA bilaterally. Respiratory effort normal. RR 14 Cardiovascular system: S1 & S2 heard, RRR. No Murmer, No pedal edema. Gastrointestinal system: Abdomen is non distended, soft and non tender. Normal bowel sounds heard. Central nervous system: Alert and oriented x 1. No focal neurological deficits. Extremities: Symmetric 5 x 5 power. Skin: No rashes, lesions or ulcers Psychiatry: Judgement and insight appear normal. Mood & affect appropriate.   Data Reviewed: I have personally reviewed following labs and imaging studies  CBC: Recent Labs  Lab 10/03/22 1354 10/04/22 0436  10/05/22 0338  WBC 26.5* 22.5* 15.4*  NEUTROABS  --  18.5* 11.2*  HGB 9.3* 8.1* 7.0*  HCT 29.2* 25.8* 22.9*  MCV 94.8 95.9 94.6  PLT 178 168 154   Basic Metabolic Panel: Recent Labs  Lab 10/03/22 1354 10/04/22 0436 10/05/22 0338  NA 134* 134* 133*  K 3.9 4.0 3.7  CL 102 103 106  CO2 21* 19* 18*  GLUCOSE 169* 97 138*  BUN 46* 44* 39*  CREATININE 1.83* 1.48* 1.18*  CALCIUM 8.8* 8.2* 7.7*  MG  --  2.1  --    GFR: Estimated Creatinine Clearance: 30.1 mL/min (A) (by C-G formula based on SCr of 1.18 mg/dL (H)). Liver Function Tests: Recent Labs  Lab 10/03/22 1354 10/04/22 0436  AST 74* 70*  ALT 25 30  ALKPHOS 83 69  BILITOT 0.7 1.0  PROT 6.9 6.0*  ALBUMIN  3.5 2.8*   No results for input(s): "LIPASE", "AMYLASE" in the last 168 hours. No results for input(s): "AMMONIA" in the last 168 hours. Coagulation Profile: No results for input(s): "INR", "PROTIME" in the last 168 hours. Cardiac Enzymes: Recent Labs  Lab 10/03/22 1354 10/04/22 0436 10/05/22 0338  CKTOTAL 1,991* 1,138* 489*   BNP (last 3 results) No results for input(s): "PROBNP" in the last 8760 hours. HbA1C: No results for input(s): "HGBA1C" in the last 72 hours. CBG: No results for input(s): "GLUCAP" in the last 168 hours. Lipid Profile: No results for input(s): "CHOL", "HDL", "LDLCALC", "TRIG", "CHOLHDL", "LDLDIRECT" in the last 72 hours. Thyroid Function Tests: Recent Labs    10/04/22 0436  TSH 0.651   Anemia Panel: No results for input(s): "VITAMINB12", "FOLATE", "FERRITIN", "TIBC", "IRON", "RETICCTPCT" in the last 72 hours. Sepsis Labs: No results for input(s): "PROCALCITON", "LATICACIDVEN" in the last 168 hours.  Recent Results (from the past 240 hour(s))  Urine Culture     Status: Abnormal   Collection Time: 10/03/22  4:39 PM   Specimen: Urine, Clean Catch  Result Value Ref Range Status   Specimen Description   Final    URINE, CLEAN CATCH Performed at [facility], [address],  [place], [place] [address]    Special Requests   Final    NONE Performed at [facility], [address], [place], [place] [address]    Culture >=100,000 COLONIES/mL ESCHERICHIA COLI (A)  Final   Report Status 10/05/2022 FINAL  Final   Organism ID, Bacteria ESCHERICHIA COLI (A)  Final      Susceptibility   Escherichia coli - MIC*    AMPICILLIN >=32 RESISTANT Resistant     CEFAZOLIN <=4 SENSITIVE Sensitive     CEFEPIME <=0.12 SENSITIVE Sensitive     CEFTRIAXONE <=0.25 SENSITIVE Sensitive     CIPROFLOXACIN >=4 RESISTANT Resistant     GENTAMICIN <=1 SENSITIVE Sensitive     IMIPENEM <=0.25 SENSITIVE Sensitive     NITROFURANTOIN <=16 SENSITIVE Sensitive     TRIMETH/SULFA >=320 RESISTANT Resistant     AMPICILLIN/SULBACTAM 16 INTERMEDIATE Intermediate     PIP/TAZO <=4 SENSITIVE Sensitive     * >=100,000 COLONIES/mL ESCHERICHIA COLI  Surgical pcr screen     Status: Abnormal   Collection Time: 10/05/22  6:33 AM   Specimen: Nasal Mucosa; Nasal Swab  Result Value Ref Range Status   MRSA, PCR NEGATIVE NEGATIVE Final   Staphylococcus aureus POSITIVE (A) NEGATIVE Final    Comment: (NOTE) The Xpert SA Assay (FDA approved for NASAL specimens in patients 69 years of age and older), is one component of a comprehensive surveillance program. It is not intended to diagnose infection nor to guide or monitor treatment. Performed at [facility], [address], [place], [place] [address]     Radiology Studies: DG C-Arm 1-60 Min-No Report  Result Date: 10/05/2022 Fluoroscopy was utilized by the requesting physician.  No radiographic interpretation.   DG C-Arm 1-60 Min-No Report  Result Date: 10/05/2022 Fluoroscopy was utilized by the requesting physician.  No radiographic interpretation.   DG Wrist Complete Right  Result Date: 10/03/2022 CLINICAL DATA:  Post reduction EXAM: RIGHT WRIST - COMPLETE 3+ VIEW COMPARISON:  10/03/2022 FINDINGS: Casting material limits bone detail. Bones appear  demineralized. Acute comminuted intra-articular distal radius fracture with impaction. Residual dorsal angulation of distal fracture fragment not much changed in the interval. Similar alignment of minimally displaced ulnar styloid fracture IMPRESSION: Similar alignment of distal radius and ulnar styloid  fractures. Electronically Signed   By: Jasmine Pang M.D.   On: 10/03/2022 22:49   DG Ribs Unilateral W/Chest Right  Result Date: 10/03/2022 CLINICAL DATA:  Post fall, now with right-sided rib pain. EXAM: RIGHT RIBS AND CHEST - 3+ VIEW COMPARISON:  06/18/2020 FINDINGS: Unchanged enlarged cardiac silhouette and mediastinal contours with atherosclerotic plaque within thoracic aorta. There is persistent thickening the right paratracheal stripe presumably secondary to prominent vasculature. No discrete focal airspace opacities. No pleural effusion or pneumothorax no evidence of edema. No acute osseous abnormalities. Specifically, no definite displaced right-sided rib fractures. Moderate to severe degenerative change of the right glenohumeral joint with joint space loss, subchondral sclerosis and inferiorly directed osteophytosis. IMPRESSION: 1. No acute cardiopulmonary disease. 2. No definite displaced right-sided rib fractures. 3. Moderate to severe degenerative change of the right glenohumeral joint. Electronically Signed   By: Simonne Come M.D.   On: 10/03/2022 17:07   CT Cervical Spine Wo Contrast  Result Date: 10/03/2022 CLINICAL DATA:  Neck trauma (Age >= 65y) EXAM: CT CERVICAL SPINE WITHOUT CONTRAST TECHNIQUE: Multidetector CT imaging of the cervical spine was performed without intravenous contrast. Multiplanar CT image reconstructions were also generated. RADIATION DOSE REDUCTION: This exam was performed according to the departmental dose-optimization program which includes automated exposure control, adjustment of the mA and/or kV according to patient size and/or use of iterative reconstruction technique.  COMPARISON:  CTA neck 07/21/2020 FINDINGS: Alignment: Mild anterolisthesis of C3 on C4, C4 on C5, C5 on C6, and C7 on T1, most likely degenerative given degenerative changes at these levels and given similar alignment on prior CTA neck. Skull base and vertebrae: No acute fracture. No primary bone lesion or focal pathologic process. Soft tissues and spinal canal: No prevertebral fluid or swelling. No visible canal hematoma. Disc levels:  Moderate multilevel degenerative change. Upper chest: Visualized lung apices are clear. IMPRESSION: 1. No evidence of acute fracture or traumatic malalignment. 2. Large (4.5 cm) right thyroid nodule. Recommend thyroid ultrasound (ref: J Am Coll Radiol. 2015 Feb;12(2): 143-50). Electronically Signed   By: Feliberto Harts M.D.   On: 10/03/2022 16:40   CT Head Wo Contrast  Result Date: 10/03/2022 CLINICAL DATA:  Trauma EXAM: CT HEAD WITHOUT CONTRAST TECHNIQUE: Contiguous axial images were obtained from the base of the skull through the vertex without intravenous contrast. RADIATION DOSE REDUCTION: This exam was performed according to the departmental dose-optimization program which includes automated exposure control, adjustment of the mA and/or kV according to patient size and/or use of iterative reconstruction technique. COMPARISON:  CT head 07/21/2020.  MRI brain 07/22/2020. FINDINGS: Brain: No evidence of acute infarction, hemorrhage, hydrocephalus, extra-axial collection or mass lesion/mass effect. Again seen is mild diffuse atrophy and mild periventricular white matter hypodensity, likely chronic small vessel ischemic change. Vascular: Atherosclerotic calcifications are present within the cavernous internal carotid arteries. Skull: Normal. Negative for fracture or focal lesion. Sinuses/Orbits: No acute finding. Other: None. IMPRESSION: 1. No acute intracranial abnormality. 2. Mild diffuse atrophy and mild chronic small vessel ischemic change. Electronically Signed   By: Darliss Cheney M.D.   On: 10/03/2022 16:36    Scheduled Meds:  [MAR Hold] amLODipine  5 mg Oral Daily   [MAR Hold] busPIRone  10 mg Oral BID   chlorhexidine  60 mL Topical Once   [MAR Hold] Chlorhexidine Gluconate Cloth  6 each Topical Daily   Chlorhexidine Gluconate Cloth  6 each Topical Q0600   [MAR Hold] donepezil  10 mg Oral QHS   [MAR Hold]  famotidine  20 mg Oral Daily   [MAR Hold] levothyroxine  25 mcg Oral Q0600   mupirocin ointment  1 Application Nasal BID   [MAR Hold] venlafaxine XR  150 mg Oral BID   Continuous Infusions:  sodium chloride 100 mL/hr at 10/04/22 1632   [MAR Hold] sodium chloride     [MAR Hold] cefTRIAXone (ROCEPHIN)  IV 1 g (10/04/22 0956)   lactated ringers       LOS: 2 days    Time spent: 50 mins    Willeen Niece, MD [facility]   If 7PM-7AM, please contact night-coverage

## 2022-10-05 NOTE — Interval H&P Note (Signed)
History and Physical Interval Note:  10/05/2022 10:51 AM  Susan Osborne  has presented today for surgery, with the diagnosis of right wrist and femur fractures.  The various methods of treatment have been discussed with the patient and family. After consideration of risks, benefits and other options for treatment, the patient has consented to  Procedure(s): OPEN REDUCTION INTERNAL FIXATION (ORIF) WRIST FRACTURE (Right) OPEN REDUCTION INTERNAL FIXATION (ORIF) DISTAL FEMUR FRACTURE (N/A) as a surgical intervention.  The patient's history has been reviewed, patient examined, no change in status, stable for surgery.  I have reviewed the patient's chart and labs.  Questions were answered to the patient's satisfaction.     Caryn Bee P Seif Teichert

## 2022-10-05 NOTE — Transfer of Care (Signed)
Immediate Anesthesia Transfer of Care Note  Patient: Susan Osborne  Procedure(s) Performed: OPEN REDUCTION INTERNAL FIXATION (ORIF) WRIST FRACTURE (Right: Wrist) OPEN REDUCTION INTERNAL FIXATION (ORIF) DISTAL FEMUR FRACTURE (Right: Leg Upper)  Patient Location: PACU  Anesthesia Type:GA combined with regional for post-op pain  Level of Consciousness: awake, alert , and oriented  Airway & Oxygen Therapy: Patient Spontanous Breathing and Patient connected to face mask oxygen  Post-op Assessment: Report given to RN and Post -op Vital signs reviewed and stable  Post vital signs: Reviewed and stable  Last Vitals:  Vitals Value Taken Time  BP 120/55 10/05/22 1441  Temp 36.7 C 10/05/22 1440  Pulse 76 10/05/22 1444  Resp 16 10/05/22 1444  SpO2 95 % 10/05/22 1444  Vitals shown include unfiled device data.  Last Pain:  Vitals:   10/05/22 1410  TempSrc:   PainSc: 0-No pain         Complications: No notable events documented.

## 2022-10-05 NOTE — Progress Notes (Signed)
[facility] Pre-op Review 1.Surgical orders: Consent orders and signed. Signed by daughter Is patient alert and oriented to sign consent? Alert and Oriented x 3 Antibiotic: Last Dose Rocephin 1 G 09:56 10/04/22 Ancef 2G pre op ordered  2.  Pre-procedure checklist completed: Request Stacie to complete  3.  NPO:  since MN  4.  CHG Bath completed:  Academic librarian, RN to complete Belongings removed  Placed in clean gown  5.  Labs Performed: CBC   Abnormal  Hgb 7.0 CMP--BMP Abnormal  Type and Screen Stat order placed for T/S and ABO Surgical PCR:  In progress Pregnancy:  NA EKG:    10/03/22 Chest x-ray:  6.  Recent H&P or progress note if inpatient: Progress note 10/04/22  7.  Language Barrier:  NA  8. Stable Vital Signs  Oxygen:  Tele:  Can the patient travel without Tele  9.  Medications: Cardiac Drips: NA Pain Medications:   Tylenol 650mg  0621 10/05/23 Oxycodone 5mg  21:40 10/04/22 Beta Blocker:    NA Anticoagulants:  NA GLP1:   NA Pre-op Medications Ordered:    10. IV access:  20G Left AC  11. Diabetic:  NA  12. Is there any additional Information that short stay should be aware of?  Floor Nurse Name and Contact information:   Stacie A. Niel Hummer, RN until 0700 10/05/23   Chat message to Loni Dolly, RN Hello, I'm an RN working at [facility] helping to expedite add on inpatient surgical cases today. I see this patient has been posted for surgery @ 10:15 Is this patient currently NPO? Have they signed the consent form?   Are they alert and oriented to sign consent?  If you haven't already, please complete pre-procedure checklist and CHG bath, have the patient change into a clean hospital gown, remove all jewelry/belongings and have them brush teeth please.  The Short Stay RN will still reach out for report and provide a time they will come for the patient.

## 2022-10-05 NOTE — Anesthesia Procedure Notes (Signed)
Procedure Name: Intubation Date/Time: 10/05/2022 11:57 AM  Performed by: Marena Chancy, CRNAPre-anesthesia Checklist: Patient identified, Emergency Drugs available, Suction available and Patient being monitored Patient Re-evaluated:Patient Re-evaluated prior to induction Oxygen Delivery Method: Circle System Utilized Preoxygenation: Pre-oxygenation with 100% oxygen Induction Type: IV induction Ventilation: Mask ventilation without difficulty Laryngoscope Size: Mac and 3 Grade View: Grade I Tube type: Oral Tube size: 7.0 mm Number of attempts: 1 Airway Equipment and Method: Stylet and Oral airway Placement Confirmation: ETT inserted through vocal cords under direct vision, positive ETCO2 and breath sounds checked- equal and bilateral Tube secured with: Tape Dental Injury: Teeth and Oropharynx as per pre-operative assessment

## 2022-10-05 NOTE — Op Note (Signed)
Orthopaedic Surgery Operative Note (CSN: [id number] ) Date of Surgery: 10/05/2022  Admit Date: 10/03/2022   Diagnoses: Pre-Op Diagnoses: Right periprosthetic femur fracture Chronic right proximal femur nonunion Right extra-articular distal radius fracture   Post-Op Diagnosis: Same  Procedures: CPT 27511-Open reduction internal fixation of right interprosthetic femur fracture CPT 25607-Open reduction internal fixation of right distal radius fracture  Surgeons : Primary: Roby Lofts, MD  Assistant: Ulyses Southward, PA-C  Location: OR 3   Anesthesia: General with regional block   Antibiotics: Ancef 2g preop with 1 gm vancomycin powder placed topically in right distal femur and radial incisions   Tourniquet time: None    Estimated Blood Loss: 100 mL  Complications: None   Specimens:* No specimens in log *   Implants: Implant Name Type Inv. Item Serial No. Manufacturer Lot No. LRB No. Used Action  Cerclage cable with crimp    ZIMMER [id number] Right 1 Implanted  PLATE DISTAL FEMUR 15H 317M RT - [id number] Plate PLATE DISTAL FEMUR 15H 317M RT  ZIMMER RECON(ORTH,TRAU,BIO,SG)  Right 1 Implanted  CAP LOCK NCB - [id number] Cap CAP LOCK NCB  ZIMMER RECON(ORTH,TRAU,BIO,SG)  Right 3 Implanted  SCREW 5.0 - [id number] Screw SCREW 5.0  ZIMMER RECON(ORTH,TRAU,BIO,SG)  Right 1 Implanted  SCREW 5.0 - [id number] Screw SCREW 5.0  ZIMMER RECON(ORTH,TRAU,BIO,SG)  Right 1 Implanted  SCREW NCB 5.0X75MM - [id number] Screw SCREW NCB 5.0X75MM  ZIMMER RECON(ORTH,TRAU,BIO,SG)  Right 3 Implanted  SCREW NCB 4.0X40MM - [id number] Screw SCREW NCB 4.0X40MM  ZIMMER RECON(ORTH,TRAU,BIO,SG)  Right 2 Implanted  SCREW UNICORTICAL NCB 5.0X20 - [id number] Screw SCREW UNICORTICAL NCB 5.0X20  ZIMMER RECON(ORTH,TRAU,BIO,SG)  Right 1 Implanted  SCREW NCB 4.0MX42M - [id number] Screw SCREW NCB 4.0MX42M  ZIMMER RECON(ORTH,TRAU,BIO,SG)  Right 2 Implanted  SCREW NCB 4.0MX50M - [id number] Screw SCREW NCB  4.0MX50M  ZIMMER RECON(ORTH,TRAU,BIO,SG)  Right 1 Implanted  SCREW NCB 4.1WE99B - [id number] Screw SCREW NCB 4.1OF75Z  ZIMMER RECON(ORTH,TRAU,BIO,SG)  Right 1 Implanted  PLATE VOLAR RT 0C/5EN-ID DRP - [id number] Plate PLATE VOLAR RT 4E/3XV-QM DRP  DEPUY ORTHOPAEDICS  Right 1 Implanted  SCREW VA LOCKING 2.4X18 - [id number] Screw SCREW VA LOCKING 2.4X18  DEPUY ORTHOPAEDICS  Right 1 Implanted  SCREW LOCKING 2.4X20 - [id number] Screw SCREW LOCKING 2.4X20  DEPUY ORTHOPAEDICS  Right 2 Implanted  SCREW LOCKING 2.4X22 - [id number] Screw SCREW LOCKING 2.4X22  DEPUY ORTHOPAEDICS  Right 1 Implanted  SCREW CORTEX 2.4X12MM - [id number] Screw SCREW CORTEX 2.4X12MM  DEPUY ORTHOPAEDICS  Right 2 Implanted  SCREW CORTEX SLFTPNG 2.4 - [id number] Screw SCREW CORTEX SLFTPNG 2.4  DEPUY ORTHOPAEDICS  Right 1 Implanted  SCREW SELF TAP - [id number] Screw SCREW SELF TAP  DEPUY ORTHOPAEDICS  Right 1 Implanted     Indications for Surgery: 89 year old female who sustained a ground-level fall with a right intra prosthetic femur shaft fracture.  She had a previous intramedullary nail of the right hip which had a chronic nonunion.  She also had an extra-articular distal radius fracture.  Due to the unstable nature of her femur and her wrist I recommend proceeding with open reduction internal fixation.  Risks and benefits were discussed with the patient's daughter.  Risks include but not limited to bleeding, infection, malunion, nonunion, hardware failure, hardware irritation, nerve and blood vessel injury, DVT, even the possibility anesthetic complications.  She agreed to proceed with surgery and consent was obtained.  Operative Findings: 1.  Open reduction internal fixation of right intra prosthetic femoral shaft fracture using  Zimmer Biomet NCB distal femoral locking plate.  I used 1 cable to provisionally reduce the femoral shaft fracture prior to plating the fracture. 2.  Chronic right proximal femoral  nonunion treated without any significant intervention.  I was able to cross the nonunion site with one of the 4.0 millimeter screws from the shaft plate. 3.  Open reduction internal fixation of right distal radius fracture using Synthes volar 2.7/2.4 mm distal radial locking plate.  Procedure: The patient was identified in the preoperative holding area. Consent was confirmed with the patient and their family and all questions were answered. The operative extremity was marked after confirmation with the patient. she was then brought back to the operating room by our anesthesia colleagues.  She was placed under general anesthetic and carefully transferred over to radiolucent flattop table.  The right upper extremity and right lower extremity were then prepped and draped in usual sterile fashion.  A timeout was performed to verify the patient, the procedure, and the extremity.  Preoperative antibiotics were dosed.  I first started out with the right lower extremity.  The hip and knee were flexed over a triangle and fluoroscopic imaging showed the unstable nature of her injury.  A lateral approach to the distal femur was carried down through skin and subcutaneous tissue.  I incised through the IT band to expose the lateral condyle of the femur.  I then mobilized the vastus lateralis to expose the main femoral shaft fracture.  I was able to clamp the fracture reduced using reduction tenaculum.  I then placed a cable using the cabling device to provisionally hold this while I remove the clamp.  I then chose a 15 hole Zimmer Biomet NCB distal femoral locking plate attached to a targeting arm and slid this submuscularly along the lateral cortex of the femur.  I placed a 3.3 mm drill bit proximally just anterior to the previous hip nail.  I then placed a 2.0 mm guidewire distally to bring the plate in appropriate alignment.  Once I confirmed positioning of the plate I then drilled and placed nonlocking screws  distally to bring the plate flush to bone.  I then placed percutaneous screws through the targeting arm in the femoral shaft attaining bicortical purchase anterior to the hip nail.  I was even able to cross the previous chronic nonunion site with a 4.0 millimeter screw to help stabilize this anterior to the nail.  I then placed locking caps on all of the proximal screws I removed the targeting arm and then proceeded to place 5.0 millimeter screws distally.  Locking caps were placed in all of the distal screws.  Final fluoroscopic imaging was obtained.  The incision was copiously irrigated.  A gram of vancomycin powder was placed to the incision.  A layered closure of 0 Vicryl, 2-0 Monocryl and 3-0 Monocryl with Dermabond was used to close the skin.  Sterile dressings were applied.  I then turned my attention to the right upper extremity.  Fluoroscopic imaging showed the unstable nature of her injury.  A standard FCR approach was made and carried down through skin subcutaneous tissue.  I incised the volar fascia over the FCR mobilized the FCR out of the way and incised through the dorsal fascia.  I then mobilized the finger flexors out of the way to visualize the pronator quadratus.  I then incised through this and released it off of the radial border of the radius.  I then exposed the fracture reduced the fracture  and used a K wire from the radial styloid crossing the fracture and gaining purchase into the ulnar side of the radial shaft.  I confirmed reduction with fluoroscopy.  I then positioned a Synthes volar distal radius plate and drilled and placed a nonlocking screw distally and proceeded to place locking screws distally.  I then drilled and placed nonlocking screws into the radial shaft to bring the plate flush to bone.  I then exchanged the nonlocking screw distally for a locking screw.  One of the locking screws appeared to be within the DRUJ so I removed this.  I had otherwise good fixation and obtained  final fluoroscopic imaging.  The incisions were irrigated and closed with 2-0 Monocryl and 3-0 Monocryl and Dermabond.  A volar resting splint was placed.  The patient was then awoke from anesthesia and taken to the PACU in stable condition.  This  Post Op Plan/Instructions: Patient will be weightbearing for transfers to the right lower extremity.  She will be weightbearing through the elbow to the right upper extremity.  She will be nonweightbearing through the wrist.  She have unrestricted range of motion of the knee and hip.  We will have her mobilize with physical and Occupational Therapy.  Will have her placed on DVT prophylaxis with Lovenox and transition to an oral DOAC upon discharge.  I was present and performed the entire surgery.  Ulyses Southward, PA-C did assist me throughout the case. An assistant was necessary given the difficulty in approach, maintenance of reduction and ability to instrument the fracture.   Truitt Merle, MD [facility]

## 2022-10-05 NOTE — Anesthesia Procedure Notes (Signed)
Anesthesia Regional Block: Femoral nerve block   Pre-Anesthetic Checklist: , timeout performed,  Correct Patient, Correct Site, Correct Laterality,  Correct Procedure, Correct Position, site marked,  Risks and benefits discussed,  Surgical consent,  Pre-op evaluation,  At surgeon's request and post-op pain management  Laterality: Right  Prep: Maximum Sterile Barrier Precautions used, chloraprep       Needles:  Injection technique: Single-shot  Needle Type: Echogenic Stimulator Needle     Needle Length: 9cm  Needle Gauge: 22     Additional Needles:   Procedures:,,,, ultrasound used (permanent image in chart),,    Narrative:  Start time: 10/05/2022 9:25 AM End time: 10/05/2022 9:30 AM Injection made incrementally with aspirations every 5 mL.  Performed by: Personally  Anesthesiologist: Lannie Fields, DO  Additional Notes: Monitors applied. No increased pain on injection. No increased resistance to injection. Injection made in 5cc increments. Good needle visualization. Patient tolerated procedure well.

## 2022-10-05 NOTE — Anesthesia Procedure Notes (Signed)
Anesthesia Regional Block: Supraclavicular block   Pre-Anesthetic Checklist: , timeout performed,  Correct Patient, Correct Site, Correct Laterality,  Correct Procedure, Correct Position, site marked,  Risks and benefits discussed,  Surgical consent,  Pre-op evaluation,  At surgeon's request and post-op pain management  Laterality: Right  Prep: Maximum Sterile Barrier Precautions used, chloraprep       Needles:  Injection technique: Single-shot  Needle Type: Echogenic Stimulator Needle     Needle Length: 9cm  Needle Gauge: 22     Additional Needles:   Procedures:,,,, ultrasound used (permanent image in chart),,    Narrative:  Start time: 10/05/2022 9:20 AM End time: 10/05/2022 9:25 AM Injection made incrementally with aspirations every 5 mL.  Performed by: Personally  Anesthesiologist: Lannie Fields, DO  Additional Notes: Monitors applied. No increased pain on injection. No increased resistance to injection. Injection made in 5cc increments. Good needle visualization. Patient tolerated procedure well.

## 2022-10-06 DIAGNOSIS — S72144A Nondisplaced intertrochanteric fracture of right femur, initial encounter for closed fracture: Secondary | ICD-10-CM

## 2022-10-06 LAB — BASIC METABOLIC PANEL
Anion gap: 6 (ref 5–15)
BUN: 27 mg/dL — ABNORMAL HIGH (ref 8–23)
CO2: 20 mmol/L — ABNORMAL LOW (ref 22–32)
Calcium: 8.1 mg/dL — ABNORMAL LOW (ref 8.9–10.3)
Chloride: 109 mmol/L (ref 98–111)
Creatinine, Ser: 1.09 mg/dL — ABNORMAL HIGH (ref 0.44–1.00)
GFR, Estimated: 47 mL/min — ABNORMAL LOW (ref >60)
Glucose, Bld: 127 mg/dL — ABNORMAL HIGH (ref 70–99)
Potassium: 4.4 mmol/L (ref 3.5–5.1)
Sodium: 135 mmol/L (ref 135–145)

## 2022-10-06 LAB — MAGNESIUM: Magnesium: 2.1 mg/dL (ref 1.7–2.4)

## 2022-10-06 LAB — CBC
HCT: 24.9 % — ABNORMAL LOW (ref 36.0–46.0)
Hemoglobin: 7.6 g/dL — ABNORMAL LOW (ref 12.0–15.0)
MCH: 28.6 pg (ref 26.0–34.0)
MCHC: 30.5 g/dL (ref 30.0–36.0)
MCV: 93.6 fL (ref 80.0–100.0)
Platelets: 167 10*3/uL (ref 150–400)
RBC: 2.66 MIL/uL — ABNORMAL LOW (ref 3.87–5.11)
RDW: 19.7 % — ABNORMAL HIGH (ref 11.5–15.5)
WBC: 14.6 10*3/uL — ABNORMAL HIGH (ref 4.0–10.5)
nRBC: 0.3 % — ABNORMAL HIGH (ref 0.0–0.2)

## 2022-10-06 LAB — BPAM RBC
Blood Product Expiration Date: 202410202359
Blood Product Expiration Date: 202410212359
ISSUE DATE / TIME: 202409250849
Unit Type and Rh: 5100
Unit Type and Rh: 5100
Unit Type and Rh: 5100

## 2022-10-06 LAB — TYPE AND SCREEN
ABO/RH(D): O POS
Antibody Screen: NEGATIVE
Unit division: 0
Unit division: 0

## 2022-10-06 LAB — PHOSPHORUS: Phosphorus: 3 mg/dL (ref 2.5–4.6)

## 2022-10-06 LAB — PREPARE RBC (CROSSMATCH)

## 2022-10-06 MED ORDER — SODIUM CHLORIDE 0.9% IV SOLUTION: Freq: Once | INTRAVENOUS | Status: AC

## 2022-10-06 NOTE — TOC Initial Note (Addendum)
Transition of Care [facility]) - Initial/Assessment Note    Patient Details  Name: Susan Osborne MRN: [id number] Date of Birth: 1928-01-09  Transition of Care [facility]) CM/SW Contact:    Lorri Frederick, LCSW Phone Number: 10/06/2022, 3:18 PM  Clinical Narrative:   Pt listed as oriented x3, confused when CSW attempted to speak with her, all information from daughter Susan Osborne also in room.  Pt from home alone.  A granddaughter is a [facility] aide and has been providing assistance at home daily.  Susan Osborne does want to pursue SNF, they are from near [place], [place], first choice would be [place].  Permission given to send out referral in hub.    CSW reached out to Patton Salles regarding [facility] accepting [place] and she will check on this and review referral.     1545: Message from [facility], they can accept the aetna, do make bed offer.  CSW updated daughter.  SNF Auth request will be submitted by [facility].                Expected Discharge Plan: Skilled Nursing Facility Barriers to Discharge: Continued Medical Work up, SNF Pending bed offer   Patient Goals and CMS Choice     Choice offered to / list presented to : Adult Children, Patient (daughter Susan Osborne)      Expected Discharge Plan and Services In-house Referral: Clinical Social Work   Post Acute Care Choice: Skilled Nursing Facility Living arrangements for the past 2 months: Single Family Home                                      Prior Living Arrangements/Services Living arrangements for the past 2 months: Single Family Home Lives with:: Self Patient language and need for interpreter reviewed:: Yes        Need for Family Participation in Patient Care: Yes (Comment) Care giver support system in place?: Yes (comment) Current home services: Homehealth aide (granddaughter is [facility] aide and provides services daily) Criminal Activity/Legal Involvement Pertinent to Current Situation/Hospitalization: No - Comment as  needed  Activities of Daily Living Home Assistive Devices/Equipment: Environmental consultant (specify type) ADL Screening (condition at time of admission) Is the patient deaf or have difficulty hearing?: Yes Does the patient have difficulty seeing, even when wearing glasses/contacts?: Yes Does the patient have difficulty concentrating, remembering, or making decisions?: Yes  Permission Sought/Granted                  Emotional Assessment Appearance:: Appears stated age Attitude/Demeanor/Rapport: Engaged Affect (typically observed): Pleasant Orientation: : Oriented to Self, Oriented to Place      Admission diagnosis:  Fall [W19.XXXA] Acute cystitis with hematuria [N30.01] Fall, initial encounter [W19.XXXA] Closed displaced fracture of styloid process of right ulna, initial encounter [S52.611A] Traumatic rhabdomyolysis, initial encounter (HCC) [T79.6XXA] Closed fracture of distal end of right radius, unspecified fracture morphology, initial encounter [S52.501A] Closed fracture of right femur, unspecified fracture morphology, initial encounter [facility]) [S72.91XA] Patient Active Problem List   Diagnosis Date Noted   Fall 10/03/2022   Closed fracture of shaft of femur (HCC) 10/03/2022   Closed right radial fracture 10/03/2022   Fracture of right ulnar styloid 10/03/2022   Rhabdomyolysis 10/03/2022   AKI (acute kidney injury) (HCC) 10/03/2022   UTI (urinary tract infection) 10/03/2022   Acute metabolic encephalopathy 07/22/2020   Obesity (BMI 30-39.9) 07/22/2020   Hypothyroidism 07/22/2020   Leukocytosis 07/22/2020   Dementia (HCC)  Hyperlipidemia    AMS (altered mental status) 07/21/2020   HTN (hypertension) 07/21/2020   PCP:  Theodoro Kos, MD Pharmacy:   [facility] [phone] - [place], [place] - [address] AT [facility] [address] [facility] [address] [place] [address] Phone: [phone] Fax: [phone]     Social Determinants of Health  (SDOH) Social History: SDOH Screenings   Food Insecurity: No Food Insecurity (10/04/2022)  Housing: Medium Risk (10/04/2022)  Transportation Needs: No Transportation Needs (10/04/2022)  Tobacco Use: Medium Risk (10/05/2022)   SDOH Interventions:     Readmission Risk Interventions     No data to display

## 2022-10-06 NOTE — Plan of Care (Signed)
  Problem: Education: Goal: Knowledge of General Education information will improve Description: Including pain rating scale, medication(s)/side effects and non-pharmacologic comfort measures Outcome: Progressing   Problem: Health Behavior/Discharge Planning: Goal: Ability to manage health-related needs will improve Outcome: Progressing   Problem: Clinical Measurements: Goal: Ability to maintain clinical measurements within normal limits will improve Outcome: Progressing Goal: Will remain free from infection Outcome: Progressing Goal: Diagnostic test results will improve Outcome: Progressing Goal: Respiratory complications will improve Outcome: Progressing Goal: Cardiovascular complication will be avoided Outcome: Progressing   Problem: Coping: Goal: Level of anxiety will decrease Outcome: Progressing   Problem: Elimination: Goal: Will not experience complications related to bowel motility Outcome: Progressing Goal: Will not experience complications related to urinary retention Outcome: Progressing   Problem: Clinical Measurements: Goal: Postoperative complications will be avoided or minimized Outcome: Progressing

## 2022-10-06 NOTE — Care Management Important Message (Signed)
Important Message  Patient Details  Name: Susan Osborne MRN: [id number] Date of Birth: 11/19/1927   Important Message Given:  Yes - Medicare IM     Sherilyn Banker 10/06/2022, 1:14 PM

## 2022-10-06 NOTE — NC FL2 (Signed)
[place] MEDICAID FL2 LEVEL OF CARE FORM     IDENTIFICATION  Patient Name: Susan Osborne Birthdate: [birth year] Sex: female Admission Date (Current Location): 10/03/2022  [facility] and [place] Number:  Producer, television/film/video and Address:  [facility] [place], [address] [place], [place] [address]      Provider Number: [id number]  Attending Physician Name and Address:  Willeen Niece, MD  Relative Name and Phone Number:  Ramos,Patricia Daughter   [phone]    Current Level of Care: Hospital Recommended Level of Care: Skilled Nursing Facility Prior Approval Number:    Date Approved/Denied:   PASRR Number:    Discharge Plan: SNF    Current Diagnoses: Patient Active Problem List   Diagnosis Date Noted   Fall 10/03/2022   Closed fracture of shaft of femur (HCC) 10/03/2022   Closed right radial fracture 10/03/2022   Fracture of right ulnar styloid 10/03/2022   Rhabdomyolysis 10/03/2022   AKI (acute kidney injury) (HCC) 10/03/2022   UTI (urinary tract infection) 10/03/2022   Acute metabolic encephalopathy 07/22/2020   Obesity (BMI 30-39.9) 07/22/2020   Hypothyroidism 07/22/2020   Leukocytosis 07/22/2020   Dementia (HCC)    Hyperlipidemia    AMS (altered mental status) 07/21/2020   HTN (hypertension) 07/21/2020    Orientation RESPIRATION BLADDER Height & Weight     Self, Situation, Place  O2 Incontinent, External catheter Weight: 179 lb 14.3 oz (81.6 kg) Height:  5' 2.99" (160 cm)  BEHAVIORAL SYMPTOMS/MOOD NEUROLOGICAL BOWEL NUTRITION STATUS      Continent    AMBULATORY STATUS COMMUNICATION OF NEEDS Skin   Total Care Verbally Surgical wounds (ecchymosis, redness)                       Personal Care Assistance Level of Assistance  Bathing, Feeding, Dressing Bathing Assistance: Maximum assistance Feeding assistance: Limited assistance Dressing Assistance: Maximum assistance     Functional Limitations Info  Sight, Hearing, Speech Sight  Info: Adequate Hearing Info: Impaired Speech Info: Adequate    SPECIAL CARE FACTORS FREQUENCY  PT (By licensed PT), OT (By licensed OT)     PT Frequency: 5x week OT Frequency: 5x week            Contractures Contractures Info: Not present    Additional Factors Info  Code Status, Allergies Code Status Info: full Allergies Info: codeine           Current Medications (10/06/2022):  This is the current hospital active medication list Current Facility-Administered Medications  Medication Dose Route Frequency Provider Last Rate Last Admin   0.9 %  sodium chloride infusion   Intravenous Continuous West Bali, PA-C 100 mL/hr at 10/04/22 1632 New Bag at 10/04/22 1632   acetaminophen (TYLENOL) tablet 650 mg  650 mg Oral Q6H West Bali, PA-C   650 mg at 10/06/22 5621   Or   acetaminophen (TYLENOL) suppository 650 mg  650 mg Rectal Q6H McClung, Sarah A, PA-C       amLODipine (NORVASC) tablet 5 mg  5 mg Oral Daily Thyra Breed A, PA-C   5 mg at 10/06/22 3086   busPIRone (BUSPAR) tablet 10 mg  10 mg Oral BID West Bali, PA-C   10 mg at 10/06/22 5784   cefTRIAXone (ROCEPHIN) 1 g in sodium chloride 0.9 % 100 mL IVPB  1 g Intravenous Q24H Thyra Breed A, PA-C 200 mL/hr at 10/06/22 1113 1 g at 10/06/22 1113   Chlorhexidine Gluconate  Cloth 2 % PADS 6 each  6 each Topical Daily West Bali, PA-C   6 each at 10/06/22 6606   Chlorhexidine Gluconate Cloth 2 % PADS 6 each  6 each Topical Q0600 West Bali, PA-C   6 each at 10/06/22 0554   docusate sodium (COLACE) capsule 100 mg  100 mg Oral BID West Bali, PA-C   100 mg at 10/06/22 3016   donepezil (ARICEPT) tablet 10 mg  10 mg Oral QHS West Bali, PA-C   10 mg at 10/05/22 2330   enoxaparin (LOVENOX) injection 40 mg  40 mg Subcutaneous Q24H Thyra Breed A, PA-C   40 mg at 10/06/22 0940   famotidine (PEPCID) tablet 20 mg  20 mg Oral Daily West Bali, PA-C   20 mg at 10/06/22 0109   levETIRAcetam  (KEPPRA) tablet 250 mg  250 mg Oral BID Willeen Niece, MD   250 mg at 10/06/22 3235   levothyroxine (SYNTHROID) tablet 25 mcg  25 mcg Oral T7322 West Bali, PA-C   25 mcg at 10/06/22 0554   metoCLOPramide (REGLAN) tablet 5-10 mg  5-10 mg Oral Q8H PRN West Bali, PA-C       Or   metoCLOPramide (REGLAN) injection 5-10 mg  5-10 mg Intravenous Q8H PRN West Bali, PA-C       morphine (PF) 2 MG/ML injection 2 mg  2 mg Intravenous Q2H PRN West Bali, PA-C   2 mg at 10/06/22 1417   mupirocin ointment (BACTROBAN) 2 % 1 Application  1 Application Nasal BID West Bali, PA-C   1 Application at 10/06/22 0941   ondansetron (ZOFRAN) tablet 4 mg  4 mg Oral Q6H PRN West Bali, PA-C       Or   ondansetron (ZOFRAN) injection 4 mg  4 mg Intravenous Q6H PRN Thyra Breed A, PA-C       oxyCODONE (Oxy IR/ROXICODONE) immediate release tablet 5 mg  5 mg Oral Q4H PRN West Bali, PA-C   5 mg at 10/06/22 1143   polyethylene glycol (MIRALAX / GLYCOLAX) packet 17 g  17 g Oral Daily PRN West Bali, PA-C       venlafaxine XR (EFFEXOR-XR) 24 hr capsule 150 mg  150 mg Oral BID West Bali, PA-C   150 mg at 10/06/22 0254   Vitamin D (Ergocalciferol) (DRISDOL) 1.25 MG (50000 UNIT) capsule 50,000 Units  50,000 Units Oral Q7 days West Bali, PA-C   50,000 Units at 10/05/22 1655     Discharge Medications: Please see discharge summary for a list of discharge medications.  Relevant Imaging Results:  Relevant Lab Results:   Additional Information SSN: [id number]  Lorri Frederick, LCSW

## 2022-10-06 NOTE — Progress Notes (Signed)
PROGRESS NOTE    Susan Osborne  NWG:[id number] DOB: 06-22-1927 DOA: 10/03/2022 PCP: Theodoro Kos, MD   Brief Narrative: This 89 yo female with the past medical history of dementia, arthritis, hypertension, hypothyroidism, hyperlipidemia, and recurrent urinary tract infections who presented after a mechanical fall. Patient was found down on her kitchen floor, landing on her right side. Apparently she had a fall from her own height while ambulating. It is estimated that she spent about 14 to 16 hrs on the floor before she was found. She was recently diagnosed with urinary tract infection as outpatient. She lives alone and uses a walker for ambulation. She required four people to lift from the floor and get into the care.  In the ED,  Her Right leg  was shortened compared to the left.  Right femur radiograph with acute severely displaced and angulated midshaft fracture of the right femur.  Right hand radiograph with moderately displaced and comminuted distal right radial fracture.  Minimally displaced ulnar styloid fracture.  Patient was admitted for further evaluation. Orthopedics was consulted.   Patient underwent ORIF right wrist fracture, ORIF right femur fracture.  POD 1  Assessment & Plan:   Active Problems:   Closed fracture of shaft of femur (HCC)   Closed right radial fracture   AKI (acute kidney injury) (HCC)   HTN (hypertension)   Hyperlipidemia   Dementia (HCC)   Hypothyroidism   UTI (urinary tract infection)  Closed fracture of shaft of femur [facility]): Traumatic fracture: Plan is to continue supportive medical therapy with analgesics, and DVT prophylaxis. Add PPI for gi prophylaxis.  Orthopedics consulted. Patient underwent ORIF right wrist fracture. POD 1 PT and OT evaluation   Closed right radial fracture: Right wrist fracture. Continue immobilization and pain control. Orthopedics consulted. Patient underwent ORIF right femur # POD 1   AKI (acute kidney injury)  (HCC) Rhabdomyolysis. Hyponatremia.  CK is trending down.  Renal functions are improving. Na 134 >135  Continue IV fluids with isotonic saline at 100 cc pr her. Follow up renal functions and electrolytes in am.  Follow up CK(it is trending down).    HTN (hypertension): Blood pressure better controlled with amlodipine.   Hyperlipidemia: Hold on atorvastatin for now considering elevated CK.  Possible resumption at the time of discharge.    Dementia [facility]): She has been living alone with supervision of her family. She may need further assistance at the time of her discharge.  No agitation. She has high risk of developing delirium. Continue with donepezil, buspirone, and venlafaxine.    Hypothyroidism: Continue with levothyroxine.  Follow up thyroid nodule as outpatient.    UTI (urinary tract infection): Urinary tract infection present on admission with no sepsis.  Plan to continue antibiotic therapy with ceftriaxone and follow up urine cultures.  Wbc is 22,5 with 18,5 PMN.   Normochromic Anemia: H&H dropped from 9.3-7.0 requiring 2 units PRBC. Monitor H&H and transfuse if hemoglobin below 7.  DVT prophylaxis:  Code Status: Full code Family Communication: No family at bed side. Disposition Plan:   Status is: Inpatient Remains inpatient appropriate because: Admitted s/p mechanical fall with rhabdomyolysis, AKI, right radius fracture and right femur fracture.  Ortho was consulted and underwent ORIF right wrist fracture, ORIF right femur fracture.  POD 1    Consultants:  Orthopaedics  Procedures: ORIF   Antimicrobials:  Anti-infectives (From admission, onward)    Start     Dose/Rate Route Frequency Ordered Stop   10/05/22 1233  vancomycin (VANCOCIN)  powder  Status:  Discontinued          As needed 10/05/22 1233 10/05/22 1404   10/05/22 0930  ceFAZolin (ANCEF) IVPB 2g/100 mL premix        2 g 200 mL/hr over 30 Minutes Intravenous On call to O.R. 10/05/22 7628 10/05/22  1232   10/05/22 0840  ceFAZolin (ANCEF) 2-4 GM/100ML-% IVPB       Note to Pharmacy: Darrick Huntsman: cabinet override      10/05/22 0840 10/05/22 1218   10/04/22 1100  cefTRIAXone (ROCEPHIN) 1 g in sodium chloride 0.9 % 100 mL IVPB        1 g 200 mL/hr over 30 Minutes Intravenous Every 24 hours 10/03/22 2012     10/03/22 2015  cefTRIAXone (ROCEPHIN) 1 g in sodium chloride 0.9 % 100 mL IVPB  Status:  Discontinued        1 g 200 mL/hr over 30 Minutes Intravenous Every 24 hours 10/03/22 2011 10/03/22 2012   10/03/22 1715  cefTRIAXone (ROCEPHIN) 2 g in sodium chloride 0.9 % 100 mL IVPB        2 g 200 mL/hr over 30 Minutes Intravenous  Once 10/03/22 1714 10/03/22 1753      Subjective: Patient was seen and examined at bedside. Overnight events noted.   Patient is s/p ORIF POD 1 .  Remains in a lot of pain. RUE in cast  Objective: Vitals:   10/05/22 1746 10/05/22 2010 10/06/22 0346 10/06/22 0815  BP: (!) 117/52 (!) 118/59 101/77 112/60  Pulse: 73 66 83 76  Resp: 17 18 16 18   Temp: 98.7 F (37.1 C) (!) 97.5 F (36.4 C) 97.7 F (36.5 C) 98.7 F (37.1 C)  TempSrc: Oral Oral Oral Oral  SpO2: 100% 100% 99% 98%  Weight:      Height:        Intake/Output Summary (Last 24 hours) at 10/06/2022 1050 Last data filed at 10/06/2022 0553 Gross per 24 hour  Intake 440 ml  Output 1050 ml  Net -610 ml   Filed Weights   10/03/22 1247 10/05/22 0954  Weight: 81.6 kg 81.6 kg   Examination:  General exam: Appears comfortable, deconditioned, not in any acute distress. Respiratory system: CTA bilaterally. Respiratory effort normal. RR 14 Cardiovascular system: S1 & S2 heard, RRR. No Murmer, No pedal edema. Gastrointestinal system: Abdomen is non distended, soft and non tender. Normal bowel sounds heard. Central nervous system: Alert and oriented x 1. No focal neurological deficits. Extremities: RUE in dressing, RLE s/p ORIF POD 1 Skin: No rashes, lesions or ulcers Psychiatry: Judgement  and insight appear normal. Mood & affect appropriate.   Data Reviewed: I have personally reviewed following labs and imaging studies  CBC: Recent Labs  Lab 10/03/22 1354 10/04/22 0436 10/05/22 0338 10/06/22 0455  WBC 26.5* 22.5* 15.4* 14.6*  NEUTROABS  --  18.5* 11.2*  --   HGB 9.3* 8.1* 7.0* 7.6*  HCT 29.2* 25.8* 22.9* 24.9*  MCV 94.8 95.9 94.6 93.6  PLT 178 168 154 167   Basic Metabolic Panel: Recent Labs  Lab 10/03/22 1354 10/04/22 0436 10/05/22 0338 10/06/22 0455  NA 134* 134* 133* 135  K 3.9 4.0 3.7 4.4  CL 102 103 106 109  CO2 21* 19* 18* 20*  GLUCOSE 169* 97 138* 127*  BUN 46* 44* 39* 27*  CREATININE 1.83* 1.48* 1.18* 1.09*  CALCIUM 8.8* 8.2* 7.7* 8.1*  MG  --  2.1  --  2.1  PHOS  --   --   --  3.0   GFR: Estimated Creatinine Clearance: 32.6 mL/min (A) (by C-G formula based on SCr of 1.09 mg/dL (H)). Liver Function Tests: Recent Labs  Lab 10/03/22 1354 10/04/22 0436  AST 74* 70*  ALT 25 30  ALKPHOS 83 69  BILITOT 0.7 1.0  PROT 6.9 6.0*  ALBUMIN 3.5 2.8*   No results for input(s): "LIPASE", "AMYLASE" in the last 168 hours. No results for input(s): "AMMONIA" in the last 168 hours. Coagulation Profile: No results for input(s): "INR", "PROTIME" in the last 168 hours. Cardiac Enzymes: Recent Labs  Lab 10/03/22 1354 10/04/22 0436 10/05/22 0338  CKTOTAL 1,991* 1,138* 489*   BNP (last 3 results) No results for input(s): "PROBNP" in the last 8760 hours. HbA1C: No results for input(s): "HGBA1C" in the last 72 hours. CBG: No results for input(s): "GLUCAP" in the last 168 hours. Lipid Profile: No results for input(s): "CHOL", "HDL", "LDLCALC", "TRIG", "CHOLHDL", "LDLDIRECT" in the last 72 hours. Thyroid Function Tests: Recent Labs    10/04/22 0436  TSH 0.651   Anemia Panel: No results for input(s): "VITAMINB12", "FOLATE", "FERRITIN", "TIBC", "IRON", "RETICCTPCT" in the last 72 hours. Sepsis Labs: No results for input(s): "PROCALCITON",  "LATICACIDVEN" in the last 168 hours.  Recent Results (from the past 240 hour(s))  Urine Culture     Status: Abnormal   Collection Time: 10/03/22  4:39 PM   Specimen: Urine, Clean Catch  Result Value Ref Range Status   Specimen Description   Final    URINE, CLEAN CATCH Performed at [facility], [address], [place], [place] [address]    Special Requests   Final    NONE Performed at [facility], [address] [place], [place] [address]    Culture >=100,000 COLONIES/mL ESCHERICHIA COLI (A)  Final   Report Status 10/05/2022 FINAL  Final   Organism ID, Bacteria ESCHERICHIA COLI (A)  Final      Susceptibility   Escherichia coli - MIC*    AMPICILLIN >=32 RESISTANT Resistant     CEFAZOLIN <=4 SENSITIVE Sensitive     CEFEPIME <=0.12 SENSITIVE Sensitive     CEFTRIAXONE <=0.25 SENSITIVE Sensitive     CIPROFLOXACIN >=4 RESISTANT Resistant     GENTAMICIN <=1 SENSITIVE Sensitive     IMIPENEM <=0.25 SENSITIVE Sensitive     NITROFURANTOIN <=16 SENSITIVE Sensitive     TRIMETH/SULFA >=320 RESISTANT Resistant     AMPICILLIN/SULBACTAM 16 INTERMEDIATE Intermediate     PIP/TAZO <=4 SENSITIVE Sensitive     * >=100,000 COLONIES/mL ESCHERICHIA COLI  Surgical pcr screen     Status: Abnormal   Collection Time: 10/05/22  6:33 AM   Specimen: Nasal Mucosa; Nasal Swab  Result Value Ref Range Status   MRSA, PCR NEGATIVE NEGATIVE Final   Staphylococcus aureus POSITIVE (A) NEGATIVE Final    Comment: (NOTE) The Xpert SA Assay (FDA approved for NASAL specimens in patients 86 years of age and older), is one component of a comprehensive surveillance program. It is not intended to diagnose infection nor to guide or monitor treatment. Performed at [facility], [address] [place], [place] [address]     Radiology Studies: DG Wrist 2 Views Right  Result Date: 10/05/2022 CLINICAL DATA:  ORIF distal radius fracture. EXAM: RIGHT WRIST - 2 VIEW COMPARISON:  Preoperative radiograph. FINDINGS:  Volar plate and screw fixation of comminuted distal radius fracture. Improved fracture alignment from preoperative imaging. Overlying splint material limits osseous and soft tissue fine detail. IMPRESSION: Volar plate and screw fixation of comminuted  distal radius fracture. Electronically Signed   By: Narda Rutherford M.D.   On: 10/05/2022 16:30   DG FEMUR PORT, MIN 2 VIEWS RIGHT  Result Date: 10/05/2022 CLINICAL DATA:  Femur fracture.  Postop. EXAM: RIGHT FEMUR PORTABLE 2 VIEW COMPARISON:  Preoperative imaging. FINDINGS: Lateral plate and multi screw fixation of femoral shaft fracture. Improved fracture alignment from preoperative exam. Previous proximal femoral hardware with cerclage wire, unchanged appearance of remote proximal femur fracture. Right knee arthroplasty. Recent postsurgical change includes air and edema in the soft tissues. IMPRESSION: ORIF of femoral shaft fracture. No immediate postoperative complication. Electronically Signed   By: Narda Rutherford M.D.   On: 10/05/2022 16:30   DG Wrist Complete Right  Result Date: 10/05/2022 CLINICAL DATA:  ORIF wrist fracture. EXAM: RIGHT WRIST - COMPLETE 3+ VIEW COMPARISON:  Preoperative imaging FINDINGS: Seven fluoroscopic spot views of the right wrist obtained in the operating room. Sequential images during volar plate and screw fixation of distal radius fracture. Fluoroscopy time 33.6 seconds. Dose 0.65 mGy. IMPRESSION: Intraoperative fluoroscopy during ORIF of distal radius fracture. Electronically Signed   By: Narda Rutherford M.D.   On: 10/05/2022 15:45   DG FEMUR, MIN 2 VIEWS RIGHT  Result Date: 10/05/2022 CLINICAL DATA:  Elective surgery. EXAM: RIGHT FEMUR 2 VIEWS COMPARISON:  Preoperative imaging. FINDINGS: Ten fluoroscopic spot views of the right femur obtained in the operating room. New plate and screw fixation of femoral shaft fracture. Previous surgical hardware about the hip and knee. Fluoroscopy time 117 seconds. Dose 15.58 mGy.  IMPRESSION: Intraoperative fluoroscopy during ORIF of femoral shaft fracture. Electronically Signed   By: Narda Rutherford M.D.   On: 10/05/2022 15:44   DG C-Arm 1-60 Min-No Report  Result Date: 10/05/2022 Fluoroscopy was utilized by the requesting physician.  No radiographic interpretation.   DG C-Arm 1-60 Min-No Report  Result Date: 10/05/2022 Fluoroscopy was utilized by the requesting physician.  No radiographic interpretation.    Scheduled Meds:  sodium chloride   Intravenous Once   acetaminophen  650 mg Oral Q6H   Or   acetaminophen  650 mg Rectal Q6H   amLODipine  5 mg Oral Daily   busPIRone  10 mg Oral BID   Chlorhexidine Gluconate Cloth  6 each Topical Daily   Chlorhexidine Gluconate Cloth  6 each Topical Q0600   docusate sodium  100 mg Oral BID   donepezil  10 mg Oral QHS   enoxaparin (LOVENOX) injection  40 mg Subcutaneous Q24H   famotidine  20 mg Oral Daily   levETIRAcetam  250 mg Oral BID   levothyroxine  25 mcg Oral Q0600   mupirocin ointment  1 Application Nasal BID   venlafaxine XR  150 mg Oral BID   Vitamin D (Ergocalciferol)  50,000 Units Oral Q7 days   Continuous Infusions:  sodium chloride 100 mL/hr at 10/04/22 1632   cefTRIAXone (ROCEPHIN)  IV 1 g (10/04/22 0956)     LOS: 3 days    Time spent: 35 mins    Willeen Niece, MD [facility]   If 7PM-7AM, please contact night-coverage

## 2022-10-06 NOTE — Evaluation (Signed)
Occupational Therapy Evaluation Patient Details Name: Susan Osborne MRN: [id number] DOB: 1927-08-08 Today's Date: 10/06/2022   History of Present Illness The pt is a 89 yo female presenting 9/23 after a fall with R hip pain. Pt from home alone, but suspected down overnight until found by family morning of 9/23. Pt also recently dx with UTI but had not started antibiotics. Work up upon admission confirmed UTI, AKI, rhabdomyolysis, fx of R ulnar styloid, R radius, and mid-shaft of R femur. Pt now s/p ORIF of R femur and R distal radius 9/25. PMH includes: dementia, HTN, R hip surgery, R TKA, and stroke.   Clinical Impression   Pt s/p above diagnosis. Pt c/o 7/10 pain in R leg/R wrist at rest, increases with movement. Pt daughter present during session, co-treat with PT. Pt lives alone, niece assists daily in the morning with bathing/IADLs, Pt uses rollator around hom, 4 steps to enter house. Pt currently requires significant assistance for bed mobility, LB ADLs, has not attempted transfer yet, and due to NWB at R wrist has difficulty with all BL activities. Pt would benefit greatly from postacute rehab <3hrs/day to maximize independence with ADLs/mobility, will be seen acutely during stay to progress as able.       If plan is discharge home, recommend the following: A lot of help with walking and/or transfers;A lot of help with bathing/dressing/bathroom;Assistance with cooking/housework;Direct supervision/assist for medications management;Help with stairs or ramp for entrance;Assist for transportation    Functional Status Assessment  Patient has had a recent decline in their functional status and demonstrates the ability to make significant improvements in function in a reasonable and predictable amount of time.  Equipment Recommendations  Other (comment) (defer)    Recommendations for Other Services       Precautions / Restrictions Precautions Precautions: Fall Restrictions Weight Bearing  Restrictions: Yes RUE Weight Bearing: Weight bear through elbow only RLE Weight Bearing: Weight bearing as tolerated      Mobility Bed Mobility Overal bed mobility: Needs Assistance Bed Mobility: Supine to Sit, Sit to Supine     Supine to sit: Mod assist, HOB elevated Sit to supine: Mod assist   General bed mobility comments: Pt mod A for bed mobility, assist to power up and scooting RLE in/out of bed    Transfers Overall transfer level: Needs assistance                 General transfer comment: did not attempt, Pt got nauseous sitting at EOB      Balance Overall balance assessment: Needs assistance   Sitting balance-Leahy Scale: Fair Sitting balance - Comments: sitting EOB, assist to scoot to edge, able to tolerate min challenge   Standing balance support: During functional activity, Reliant on assistive device for balance Standing balance-Leahy Scale: Poor Standing balance comment: standing x2 HHA mod A, will need R platform walker                           ADL either performed or assessed with clinical judgement   ADL Overall ADL's : Needs assistance/impaired Eating/Feeding: Set up;Sitting   Grooming: Set up;Sitting   Upper Body Bathing: Moderate assistance;Sitting   Lower Body Bathing: Maximal assistance;Sitting/lateral leans   Upper Body Dressing : Moderate assistance;Sitting   Lower Body Dressing: Maximal assistance;Sitting/lateral leans       Toileting- Clothing Manipulation and Hygiene: Maximal assistance;Sitting/lateral lean         General ADL Comments: Pt requires  significant assistance for LB ADLs, unable to perform BL ADLs due to NWB at R wrist, Pt did not complete transfer today     Vision Baseline Vision/History: 1 Wears glasses Ability to See in Adequate Light: 0 Adequate Patient Visual Report: No change from baseline       Perception         Praxis         Pertinent Vitals/Pain Pain Assessment Pain  Assessment: 0-10 Pain Score: 7  Pain Location: R leg, R wrist Pain Descriptors / Indicators: Aching, Constant, Grimacing Pain Intervention(s): Monitored during session     Extremity/Trunk Assessment Upper Extremity Assessment Upper Extremity Assessment: RUE deficits/detail RUE Deficits / Details: WB through elbow, able to perform ROM at fingers, elbow, shoulder. Pt states has numbness through R thumb, some edema noted in R hand/wrist limiting grip/pinch RUE Sensation: decreased light touch RUE Coordination: decreased fine motor   Lower Extremity Assessment Lower Extremity Assessment: Defer to PT evaluation       Communication Communication Communication: No apparent difficulties   Cognition Arousal: Alert Behavior During Therapy: WFL for tasks assessed/performed Overall Cognitive Status: History of cognitive impairments - at baseline                                 General Comments: A/Ox3, not oriented to date. Pt remembers fall.     General Comments       Exercises     Shoulder Instructions      Home Living Family/patient expects to be discharged to:: Private residence Living Arrangements: Alone Available Help at Discharge: Family Type of Home: House       Home Layout: One level     Bathroom Shower/Tub: Chief Strategy Officer: Handicapped height     Home Equipment: Rollator (4 wheels);Shower seat;Wheelchair - manual;Grab bars - toilet;Grab bars - tub/shower   Additional Comments: niece visits daily in morning      Prior Functioning/Environment Prior Level of Function : Needs assist             Mobility Comments: uses rollator around house ADLs Comments: niece assists daily with bathing, some ADLs/IADLs        OT Problem List: Decreased strength;Decreased range of motion;Decreased activity tolerance;Impaired balance (sitting and/or standing);Decreased cognition;Decreased safety awareness;Decreased knowledge of use of DME  or AE;Impaired UE functional use;Pain;Increased edema      OT Treatment/Interventions: Self-care/ADL training;Therapeutic exercise;Neuromuscular education;Energy conservation;DME and/or AE instruction;Manual therapy;Therapeutic activities;Patient/family education;Balance training    OT Goals(Current goals can be found in the care plan section) Acute Rehab OT Goals Patient Stated Goal: to improve mobility OT Goal Formulation: With patient/family Time For Goal Achievement: 10/20/22 Potential to Achieve Goals: Good  OT Frequency: Min 1X/week    Co-evaluation PT/OT/SLP Co-Evaluation/Treatment: Yes Reason for Co-Treatment: Complexity of the patient's impairments (multi-system involvement);To address functional/ADL transfers   OT goals addressed during session: ADL's and self-care;Proper use of Adaptive equipment and DME;Other (comment) (R wrist fx)      AM-PAC OT "6 Clicks" Daily Activity     Outcome Measure Help from another person eating meals?: A Little Help from another person taking care of personal grooming?: A Little Help from another person toileting, which includes using toliet, bedpan, or urinal?: A Lot Help from another person bathing (including washing, rinsing, drying)?: A Lot Help from another person to put on and taking off regular upper body clothing?: A Lot Help from another  person to put on and taking off regular lower body clothing?: A Lot 6 Click Score: 14   End of Session Equipment Utilized During Treatment: Gait belt Nurse Communication: Mobility status  Activity Tolerance: Patient tolerated treatment well Patient left: in bed;with call bell/phone within reach;with family/visitor present  OT Visit Diagnosis: Unsteadiness on feet (R26.81);Other abnormalities of gait and mobility (R26.89);Repeated falls (R29.6);Muscle weakness (generalized) (M62.81);Other symptoms and signs involving cognitive function;Pain Pain - Right/Left: Right Pain - part of body: Leg;Hand                 Time: 3235-5732 OT Time Calculation (min): 41 min Charges:  OT General Charges $OT Visit: 1 Visit OT Evaluation $OT Eval Moderate Complexity: 1 Mod OT Treatments $Self Care/Home Management : 8-22 mins  [place], OTR/L   Alexis Goodell 10/06/2022, 11:54 AM

## 2022-10-06 NOTE — Evaluation (Signed)
Physical Therapy Evaluation Patient Details Name: Susan Osborne MRN: [id number] DOB: 09-13-1927 Today's Date: 10/06/2022  History of Present Illness  The pt is a 89 yo female presenting 9/23 after a fall with R hip pain. Pt from home alone, but suspected down overnight until found by family morning of 9/23. Pt also recently dx with UTI but had not started antibiotics. Work up upon admission confirmed UTI, AKI, rhabdomyolysis, fx of R ulnar styloid, R radius, and mid-shaft of R femur. Pt now s/p ORIF of R femur and R distal radius 9/25. PMH includes: dementia, HTN, R hip surgery, R TKA, and stroke.   Clinical Impression  Pt in bed upon arrival of PT, agreeable to evaluation at this time. Prior to admission the pt was using a rollator for mobility, reports no other recent falls. The pt was unable to give consistent answers regarding PLOF, assistance for ADLs, or home set up, her daughter was present to confirm. The pt was able to complete bed mobility with minA, but needed up to modA of 2 to complete sit-stand transfers this session due to pain in RLE with movement. She was able to balance with BUE support (through elbow on RLE) but was unable to trail platform RW due to onset of nausea with activity. Will continue to progress as tolerated, but as pt home from home alone, will need continued inpatient rehab <3hrs/day prior to return home. Family in agreement.         If plan is discharge home, recommend the following: A lot of help with bathing/dressing/bathroom;Two people to help with walking and/or transfers;Direct supervision/assist for medications management;Assistance with cooking/housework;Help with stairs or ramp for entrance;Supervision due to cognitive status;Direct supervision/assist for financial management;Assist for transportation   Can travel by private vehicle   No    Equipment Recommendations  (defer to post acute)  Recommendations for Other Services       Functional Status  Assessment Patient has had a recent decline in their functional status and demonstrates the ability to make significant improvements in function in a reasonable and predictable amount of time.     Precautions / Restrictions Precautions Precautions: Fall Restrictions Weight Bearing Restrictions: Yes RUE Weight Bearing: Weight bear through elbow only RLE Weight Bearing: Weight bearing as tolerated      Mobility  Bed Mobility Overal bed mobility: Needs Assistance Bed Mobility: Supine to Sit, Sit to Supine     Supine to sit: Mod assist, HOB elevated Sit to supine: Mod assist   General bed mobility comments: Pt mod A for bed mobility, assist to power up and scooting RLE in/out of bed    Transfers Overall transfer level: Needs assistance Equipment used: 2 person hand held assist Transfers: Sit to/from Stand Sit to Stand: Mod assist, +2 physical assistance, Min assist           General transfer comment: modA at R elbow and gait belt on R side and minA to LUE from therapist on L. pt with limited wt on RLE due to pain. unable to progress to stepping due to onset of nausea with initial stand    Ambulation/Gait               General Gait Details: deferred due to nausea with initial stand    Balance Overall balance assessment: Needs assistance   Sitting balance-Leahy Scale: Fair Sitting balance - Comments: sitting EOB, assist to scoot to edge, able to tolerate min challenge   Standing balance support: During functional activity, [place]  on assistive device for balance Standing balance-Leahy Scale: Poor Standing balance comment: standing with HHA mod A, limited wt on RLE due to pain                             Pertinent Vitals/Pain Pain Assessment Pain Assessment: 0-10 Pain Score: 7  Pain Location: R leg, R wrist Pain Descriptors / Indicators: Aching, Constant, Grimacing Pain Intervention(s): Limited activity within patient's tolerance, Monitored during  session, Repositioned    Home Living Family/patient expects to be discharged to:: Private residence Living Arrangements: Alone Available Help at Discharge: Family Type of Home: House Home Access: Ramped entrance       Home Layout: One level Home Equipment: Rollator (4 wheels);Shower seat;Wheelchair - manual;Grab bars - toilet;Grab bars - tub/shower Additional Comments: niece visits daily in morning    Prior Function Prior Level of Function : Needs assist             Mobility Comments: uses rollator around house ADLs Comments: niece assists daily with bathing, some ADLs/IADLs     Extremity/Trunk Assessment   Upper Extremity Assessment Upper Extremity Assessment: Defer to OT evaluation RUE Deficits / Details: WB through elbow, able to perform ROM at fingers, elbow, shoulder. Pt states has numbness through R thumb, some edema noted in R hand/wrist limiting grip/pinch RUE Sensation: decreased light touch RUE Coordination: decreased fine motor    Lower Extremity Assessment Lower Extremity Assessment: LLE deficits/detail;RLE deficits/detail RLE Deficits / Details: limited by pain, able to demo SLR against gravity and wiggle toes, reports sensation intact. not fully assessed due to NWB, impaired knee flexion due to pain RLE: Unable to fully assess due to pain;Unable to fully assess due to immobilization RLE Sensation: WNL RLE Coordination: WNL LLE Deficits / Details: grossly intact, poor power to manage sit-stand without much wt on RLE LLE Sensation: WNL LLE Coordination: WNL    Cervical / Trunk Assessment Cervical / Trunk Assessment: Normal  Communication   Communication Communication: No apparent difficulties Cueing Techniques: Verbal cues;Visual cues;Gestural cues  Cognition Arousal: Alert Behavior During Therapy: WFL for tasks assessed/performed Overall Cognitive Status: History of cognitive impairments - at baseline                                  General Comments: A/Ox3, not oriented to date. Pt states she remembers fall but family staing pt's story is inaccurate. Pt giving many incorrect answers for PLOF and home set up.        General Comments General comments (skin integrity, edema, etc.): R platform walker in room and set up, unable to trial this session due to onset of nausea        Assessment/Plan    PT Assessment Patient needs continued PT services  PT Problem List Decreased strength;Decreased range of motion;Decreased activity tolerance;Decreased balance;Decreased mobility;Obesity       PT Treatment Interventions DME instruction;Gait training;Therapeutic activities;Functional mobility training;Therapeutic exercise;Balance training;Patient/family education    PT Goals (Current goals can be found in the Care Plan section)  Acute Rehab PT Goals Patient Stated Goal: return to her home when able PT Goal Formulation: With patient/family Time For Goal Achievement: 10/20/22 Potential to Achieve Goals: Good    Frequency Min 1X/week     Co-evaluation PT/OT/SLP Co-Evaluation/Treatment: Yes Reason for Co-Treatment: Complexity of the patient's impairments (multi-system involvement);To address functional/ADL transfers PT goals addressed during session: Mobility/safety  with mobility;Balance;Proper use of DME;Strengthening/ROM OT goals addressed during session: ADL's and self-care;Proper use of Adaptive equipment and DME;Other (comment) (R wrist fx)       AM-PAC PT "6 Clicks" Mobility  Outcome Measure Help needed turning from your back to your side while in a flat bed without using bedrails?: A Little Help needed moving from lying on your back to sitting on the side of a flat bed without using bedrails?: A Lot Help needed moving to and from a bed to a chair (including a wheelchair)?: A Lot Help needed standing up from a chair using your arms (e.g., wheelchair or bedside chair)?: A Lot Help needed to walk in hospital  room?: Total Help needed climbing 3-5 steps with a railing? : Total 6 Click Score: 11    End of Session Equipment Utilized During Treatment: Gait belt Activity Tolerance: Patient tolerated treatment well Patient left: in bed;with call bell/phone within reach;with bed alarm set;with family/visitor present Nurse Communication: Mobility status PT Visit Diagnosis: Unsteadiness on feet (R26.81);Other abnormalities of gait and mobility (R26.89);Muscle weakness (generalized) (M62.81);Pain Pain - Right/Left: Right Pain - part of body: Leg    Time: 8295-6213 PT Time Calculation (min) (ACUTE ONLY): 39 min   Charges:   PT Evaluation $PT Eval Moderate Complexity: 1 Mod   PT General Charges $$ ACUTE PT VISIT: 1 Visit         Vickki Muff, PT, DPT   Acute Rehabilitation Department Office [phone] Secure Chat Communication Preferred  Susan Osborne 10/06/2022, 1:29 PM

## 2022-10-06 NOTE — Progress Notes (Signed)
Orthopaedic Trauma Progress Note  SUBJECTIVE: Doing ok this AM. Notes moderate pain in the right thumb and right leg. Received Tylenol about 1.5 hours ago. Tolerating RUE splint well, doesn't feel like it is too tight. Has not been up out of bed yet since surgery. No chest pain. No SOB. No nausea/vomiting. No other complaints. Patient has been to [facility] & rehab in the past, would prefer this location if SNF required at d/c  OBJECTIVE:  Vitals:   10/06/22 0346 10/06/22 0815  BP: 101/77 112/60  Pulse: 83 76  Resp: 16 18  Temp: 97.7 F (36.5 C) 98.7 F (37.1 C)  SpO2: 99% 98%    General: Sitting up in bed, NAD Respiratory: No increased work of breathing.  RLE: dressing CDI. Tender over the knee and throughout the thigh as expected. No significant calf tenderness. Ankle DF/PF intact. Endorses sensation over all aspects of foot. Able to wiggle toes. +DP pulse RUE: Volar splint in place. Bruising noted though the thumb and up above splint. Non-tender above splint. Motor and sensory function intact through median, ulnar, radial nerve distribution. Fingers warm and well perfused.   IMAGING: Stable post op imaging.   LABS:  Results for orders placed or performed during the hospital encounter of 10/03/22 (from the past 24 hour(s))  CBC     Status: Abnormal   Collection Time: 10/06/22  4:55 AM  Result Value Ref Range   WBC 14.6 (H) 4.0 - 10.5 K/uL   RBC 2.66 (L) 3.87 - 5.11 MIL/uL   Hemoglobin 7.6 (L) 12.0 - 15.0 g/dL   HCT 40.9 (L) 81.1 - 91.4 %   MCV 93.6 80.0 - 100.0 fL   MCH 28.6 26.0 - 34.0 pg   MCHC 30.5 30.0 - 36.0 g/dL   RDW 78.2 (H) 95.6 - 21.3 %   Platelets 167 150 - 400 K/uL   nRBC 0.3 (H) 0.0 - 0.2 %  Basic metabolic panel     Status: Abnormal   Collection Time: 10/06/22  4:55 AM  Result Value Ref Range   Sodium 135 135 - 145 mmol/L   Potassium 4.4 3.5 - 5.1 mmol/L   Chloride 109 98 - 111 mmol/L   CO2 20 (L) 22 - 32 mmol/L   Glucose, Bld 127 (H) 70 - 99 mg/dL    BUN 27 (H) 8 - 23 mg/dL   Creatinine, Ser 0.86 (H) 0.44 - 1.00 mg/dL   Calcium 8.1 (L) 8.9 - 10.3 mg/dL   GFR, Estimated 47 (L) >60 mL/min   Anion gap 6 5 - 15  Phosphorus     Status: None   Collection Time: 10/06/22  4:55 AM  Result Value Ref Range   Phosphorus 3.0 2.5 - 4.6 mg/dL  Magnesium     Status: None   Collection Time: 10/06/22  4:55 AM  Result Value Ref Range   Magnesium 2.1 1.7 - 2.4 mg/dL  Prepare RBC (crossmatch)     Status: None   Collection Time: 10/06/22 10:48 AM  Result Value Ref Range   Order Confirmation      ORDER PROCESSED BY BLOOD BANK BB SAMPLE OR UNITS ALREADY AVAILABLE Performed at [facility], [address], [place], [place] [address]     ASSESSMENT: Susan Osborne is a 89 y.o. female, 1 Day Post-Op s/p OPEN REDUCTION INTERNAL FIXATION RIGHT WRIST FRACTURE OPEN REDUCTION INTERNAL FIXATION RIGHT DISTAL FEMUR FRACTURE  CV/Blood loss: Acute blood loss anemia, Hgb 7.6 this AM. Received 2 unit PRBCs  per-op on 10/05/22. 1 additional unit PRBCs ordered per primary team this AM. Hemodynamically stable  PLAN: Weightbearing: WBAT RLE for transfers only. WBAT thru R elbow wtih platform walker ROM:  RUE - maintain splint, ok for finger ROM. Unrestricted elbow ROM RLE - unrestricted ROM  Incisional and dressing care:  RUE - maintain splint until f/u RLE - Reinforce dressing PRN  Showering:  Ok to begin getting RLE incisions wet 10/08/22. Keep RUE splint dry Orthopedic device(s): Splint RUE Pain management:  1. Tylenol 650 mg q 6 hours scheduled 2. Oxycodone 5 mg q 4 hours PRN 3. Morphine 2 mg q 2 hours PRN VTE prophylaxis: Lovenox, SCDs ID:  Ceftriaxone for UTI. No additional abx needed from surgical standpoint Foley/Lines:  No foley, KVO IVFs Impediments to Fracture Healing: Vit D level 30, started on supplementation Dispo: PT/OT eval today. Will likely need SNF.    D/C recommendations: - Oxycodone 5 mg, Tylenol PRN for pain control - Eliquis 2.5  mg BID x 30 days for DVT prophylaxis - Continue 50,000 units Vit D2 supplementation q 7 days x 5 weeks  Follow - up plan: 2 weeks after d/c   Contact information:  Truitt Merle MD, Thyra Breed PA-C. After hours and holidays please check [email or web address] for group call information for [facility]   Thompson Caul, PA-C [phone] (office) [email or web address]

## 2022-10-07 DIAGNOSIS — S72041A Displaced fracture of base of neck of right femur, initial encounter for closed fracture: Secondary | ICD-10-CM

## 2022-10-07 LAB — MAGNESIUM: Magnesium: 1.9 mg/dL (ref 1.7–2.4)

## 2022-10-07 LAB — CBC
HCT: 27.7 % — ABNORMAL LOW (ref 36.0–46.0)
Hemoglobin: 8.5 g/dL — ABNORMAL LOW (ref 12.0–15.0)
MCH: 28.5 pg (ref 26.0–34.0)
MCHC: 30.7 g/dL (ref 30.0–36.0)
MCV: 93 fL (ref 80.0–100.0)
Platelets: 190 10*3/uL (ref 150–400)
RBC: 2.98 MIL/uL — ABNORMAL LOW (ref 3.87–5.11)
RDW: 19 % — ABNORMAL HIGH (ref 11.5–15.5)
WBC: 14.6 10*3/uL — ABNORMAL HIGH (ref 4.0–10.5)
nRBC: 0.3 % — ABNORMAL HIGH (ref 0.0–0.2)

## 2022-10-07 LAB — BASIC METABOLIC PANEL
Anion gap: 8 (ref 5–15)
BUN: 19 mg/dL (ref 8–23)
CO2: 22 mmol/L (ref 22–32)
Calcium: 8.2 mg/dL — ABNORMAL LOW (ref 8.9–10.3)
Chloride: 109 mmol/L (ref 98–111)
Creatinine, Ser: 0.89 mg/dL (ref 0.44–1.00)
GFR, Estimated: >60 mL/min (ref >60)
Glucose, Bld: 90 mg/dL (ref 70–99)
Potassium: 4.1 mmol/L (ref 3.5–5.1)
Sodium: 139 mmol/L (ref 135–145)

## 2022-10-07 LAB — PHOSPHORUS: Phosphorus: 2.1 mg/dL — ABNORMAL LOW (ref 2.5–4.6)

## 2022-10-07 MED ORDER — ASPIRIN-DIPYRIDAMOLE ER 25-200 MG PO CP12
1.0000 | ORAL_CAPSULE | Freq: Two times a day (BID) | ORAL | Status: DC
  Administered 2022-10-07 – 2022-10-10 (×7): 1 via ORAL
  Filled 2022-10-07 (×8): qty 1

## 2022-10-07 MED ORDER — OXYCODONE HCL 5 MG PO TABS: 5.0000 mg | ORAL_TABLET | ORAL | 0 refills | Status: AC | PRN

## 2022-10-07 MED ORDER — APIXABAN 2.5 MG PO TABS: 2.5000 mg | ORAL_TABLET | Freq: Two times a day (BID) | ORAL | 0 refills | Status: AC

## 2022-10-07 MED ORDER — VITAMIN D (ERGOCALCIFEROL) 1.25 MG (50000 UNIT) PO CAPS: 50000.0000 [IU] | ORAL_CAPSULE | ORAL | 0 refills | Status: AC

## 2022-10-07 NOTE — Progress Notes (Signed)
Physical Therapy Treatment Patient Details Name: Susan Osborne MRN: [id number] DOB: 1927-06-29 Today's Date: 10/07/2022   History of Present Illness The pt is a 89 yo female presenting 9/23 after a fall with R hip pain. Pt from home alone, but suspected down overnight until found by family morning of 9/23. Pt also recently dx with UTI but had not started antibiotics. Work up upon admission confirmed UTI, AKI, rhabdomyolysis, fx of R ulnar styloid, R radius, and mid-shaft of R femur. Pt now s/p ORIF of R femur and R distal radius 9/25. PMH includes: dementia, HTN, R hip surgery, R TKA, and stroke.    PT Comments  The pt was seen for continued mobility progress, and attempt to use platform RW this session. She was eager to mobilize, but had increased difficulty with powering up to standing using LLE, LUE, and assistance. Pt is WBAT on RLE but reluctant to use much this session due to pain. Despite repeated attempts, pt unable to achieve full stand, maintains trunk and hip flexion with posterior lean and dependent on LUE and therapist support to maintain upright. Will continue to progress as pt is able to tolerate, continue to recommend post-acute therapies.     If plan is discharge home, recommend the following: A lot of help with bathing/dressing/bathroom;Two people to help with walking and/or transfers;Direct supervision/assist for medications management;Assistance with cooking/housework;Help with stairs or ramp for entrance;Supervision due to cognitive status;Direct supervision/assist for financial management;Assist for transportation   Can travel by private vehicle     No  Equipment Recommendations   (defer to post acute)    Recommendations for Other Services       Precautions / Restrictions Precautions Precautions: Fall Restrictions Weight Bearing Restrictions: Yes RUE Weight Bearing: Weight bear through elbow only RLE Weight Bearing: Weight bearing as tolerated (for transfers only) Other  Position/Activity Restrictions: WBAT for transfers only, able to use platform with RUE as needed     Mobility  Bed Mobility Overal bed mobility: Needs Assistance Bed Mobility: Supine to Sit, Sit to Supine     Supine to sit: Mod assist, HOB elevated Sit to supine: Mod assist, +2 for physical assistance, Used rails   General bed mobility comments: modA for bed mobility, needing assist to RLE and to adjust in bed +2 to boost back into position    Transfers Overall transfer level: Needs assistance Equipment used: Right platform walker Transfers: Sit to/from Stand Sit to Stand: Max assist, From elevated surface           General transfer comment: pt unable to achieve stand despite mod-maxA of 1 and elevated bed. limited wt on RLE and poor hip extension despite assist and cues. Unable to achieve full upright despite attempts x4    Ambulation/Gait               General Gait Details: unable to progress to full stand this session   Stairs             Wheelchair Mobility     Tilt Bed    Modified Rankin (Stroke Patients Only)       Balance Overall balance assessment: Needs assistance   Sitting balance-Leahy Scale: Fair Sitting balance - Comments: sitting EOB, assist to scoot to edge, able to tolerate min challenge   Standing balance support: During functional activity, Reliant on assistive device for balance Standing balance-Leahy Scale: Poor Standing balance comment: standing with HHA mod A, limited wt on RLE due to pain  Cognition Arousal: Alert Behavior During Therapy: WFL for tasks assessed/performed Overall Cognitive Status: History of cognitive impairments - at baseline                                 General Comments: pt able to follow simple commands, needing cues for posture and position. continued to state that she "just needs a little time" to recover        Exercises General Exercises -  Lower Extremity Ankle Circles/Pumps: Strengthening, Left, 10 reps, Limitations Ankle Circles/Pumps Limitations: poor R ankle DF Long Arc Quad: AAROM, Left, 10 reps, Limitations Long Arc Quad Limitations: small ROM, AAROM to support LE    General Comments General comments (skin integrity, edema, etc.): VSS on RA. pt unable to perform toe raises RLE      Pertinent Vitals/Pain Pain Assessment Pain Assessment: Faces Faces Pain Scale: Hurts even more Pain Location: R leg, R wrist Pain Descriptors / Indicators: Aching, Constant, Grimacing Pain Intervention(s): Limited activity within patient's tolerance, Monitored during session, Repositioned    Home Living                          Prior Function            PT Goals (current goals can now be found in the care plan section) Acute Rehab PT Goals Patient Stated Goal: return to her home when able PT Goal Formulation: With patient/family Time For Goal Achievement: 10/20/22 Potential to Achieve Goals: Good Progress towards PT goals: Progressing toward goals    Frequency    Min 1X/week      PT Plan      Co-evaluation              AM-PAC PT "6 Clicks" Mobility   Outcome Measure  Help needed turning from your back to your side while in a flat bed without using bedrails?: A Little Help needed moving from lying on your back to sitting on the side of a flat bed without using bedrails?: A Lot Help needed moving to and from a bed to a chair (including a wheelchair)?: A Lot Help needed standing up from a chair using your arms (e.g., wheelchair or bedside chair)?: A Lot Help needed to walk in hospital room?: Total Help needed climbing 3-5 steps with a railing? : Total 6 Click Score: 11    End of Session Equipment Utilized During Treatment: Gait belt Activity Tolerance: Patient tolerated treatment well Patient left: in bed;with call bell/phone within reach;with bed alarm set;with family/visitor present Nurse  Communication: Mobility status PT Visit Diagnosis: Unsteadiness on feet (R26.81);Other abnormalities of gait and mobility (R26.89);Muscle weakness (generalized) (M62.81);Pain Pain - Right/Left: Right Pain - part of body: Leg     Time: 0981-1914 PT Time Calculation (min) (ACUTE ONLY): 36 min  Charges:    $Therapeutic Exercise: 8-22 mins $Therapeutic Activity: 8-22 mins PT General Charges $$ ACUTE PT VISIT: 1 Visit                     Vickki Muff, PT, DPT   Acute Rehabilitation Department Office [phone] Secure Chat Communication Preferred   Ronnie Derby 10/07/2022, 11:31 AM

## 2022-10-07 NOTE — Plan of Care (Signed)
  Problem: Education: Goal: Knowledge of General Education information will improve Description: Including pain rating scale, medication(s)/side effects and non-pharmacologic comfort measures Outcome: Progressing   Problem: Clinical Measurements: Goal: Respiratory complications will improve Outcome: Progressing   Problem: Nutrition: Goal: Adequate nutrition will be maintained Outcome: Progressing   

## 2022-10-07 NOTE — Progress Notes (Addendum)
Orthopaedic Trauma Progress Note  SUBJECTIVE: Doing fairly well this morning.  Feels that she is moving her better today than she was yesterday.  Tolerating RUE splint well, doesn't feel like it is too tight.  Got dizzy when she stood up with therapies yesterday, therefore did not do much mobility.  No chest pain. No SOB. No nausea/vomiting. No other complaints. Patient has been to [facility] & rehab in the past, would prefer this location if SNF required at d/c  OBJECTIVE:  Vitals:   10/06/22 2040 10/07/22 0423  BP: (!) 121/52 133/60  Pulse: 82 74  Resp: 18 18  Temp: 98.9 F (37.2 C) 98.5 F (36.9 C)  SpO2: 93% 93%    General: Sitting up in bed, NAD Respiratory: No increased work of breathing.  RLE: Ace wrap dressing removed.  Mepilex dressings clean, dry, intact.  Does have some bruising about the knee which is to be expected.  Tender over the knee and throughout the thigh as expected. No significant calf tenderness. Ankle DF/PF intact. Endorses sensation over all aspects of foot. Able to wiggle toes. +DP pulse  RUE: Volar splint in place. Bruising noted though the thumb and up above splint. Non-tender above splint. Motor and sensory function intact through median, ulnar, radial nerve distribution. Fingers warm and well perfused.   IMAGING: Stable post op imaging.   LABS:  No results found for this or any previous visit (from the past 24 hour(s)).   ASSESSMENT: Susan Osborne is a 89 y.o. female, 2 Days Post-Op s/p OPEN REDUCTION INTERNAL FIXATION RIGHT WRIST FRACTURE OPEN REDUCTION INTERNAL FIXATION RIGHT DISTAL FEMUR FRACTURE  CV/Blood loss: Acute blood loss anemia, Hgb 7.6  on 10/06/22. Has received 3 units total PRBCs. CBC this morning pending. Hemodynamically stable  PLAN: Weightbearing: WBAT RLE for transfers only. WBAT thru R elbow wtih platform walker ROM:  RUE - maintain splint, ok for finger ROM. Unrestricted elbow ROM RLE - unrestricted ROM  Incisional and  dressing care:  RUE - maintain splint until f/u RLE - Change Mepilex dressing PRN Showering:  Ok to begin getting RLE incisions wet 10/08/22. Keep RUE splint dry Orthopedic device(s): Splint RUE Pain management:  1. Tylenol 650 mg q 6 hours scheduled 2. Oxycodone 5 mg q 4 hours PRN 3. Morphine 2 mg q 2 hours PRN VTE prophylaxis: Lovenox, SCDs ID:  Ceftriaxone for UTI. No additional abx needed from surgical standpoint Foley/Lines: Foley in place.  KVO IVFs Impediments to Fracture Healing: Vit D level 30, started on supplementation Dispo: PT/OT eval ongoing. TOC following for SNF placement.  I have signed and placed discharge Rx for pain medication, DVT prophylaxis, vitamin D supplementation and patient's chart.  D/C recommendations: - Oxycodone 5 mg, Tylenol PRN for pain control - Eliquis 2.5 mg BID x 30 days for DVT prophylaxis - Continue 50,000 units Vit D2 supplementation q 7 days x 5 weeks  Follow - up plan: 2 weeks after d/c for wound check and repeat x-rays   Contact information:  Truitt Merle MD, Thyra Breed PA-C. After hours and holidays please check [email or web address] for group call information for [facility]   Thompson Caul, PA-C [phone] (office) [email or web address]

## 2022-10-07 NOTE — TOC Progression Note (Signed)
Transition of Care [facility]) - Progression Note    Patient Details  Name: Taleia Sadowski MRN: [id number] Date of Birth: Oct 06, 1927  [facility] CM/SW Contact  Lorri Frederick, LCSW Phone Number: 10/07/2022, 10:04 AM  Clinical Narrative:   Angela/CMA having issues with [employer] Portal.  CSW spoke with [facility].  She will ask SNF to attempt to initiate auth.     Expected Discharge Plan: Skilled Nursing Facility Barriers to Discharge: Continued Medical Work up, SNF Pending bed offer  Expected Discharge Plan and Services In-house Referral: Clinical Social Work   Post Acute Care Choice: Skilled Nursing Facility Living arrangements for the past 2 months: Single Family Home                                       Social Determinants of Health (SDOH) Interventions SDOH Screenings   Food Insecurity: No Food Insecurity (10/04/2022)  Housing: Medium Risk (10/04/2022)  Transportation Needs: No Transportation Needs (10/04/2022)  Tobacco Use: Medium Risk (10/05/2022)    Readmission Risk Interventions     No data to display

## 2022-10-07 NOTE — Plan of Care (Signed)
  Problem: Education: Goal: Knowledge of General Education information will improve Description Including pain rating scale, medication(s)/side effects and non-pharmacologic comfort measures Outcome: Progressing   Problem: Health Behavior/Discharge Planning: Goal: Ability to manage health-related needs will improve Outcome: Progressing   Problem: Clinical Measurements: Goal: Ability to maintain clinical measurements within normal limits will improve Outcome: Progressing Goal: Will remain free from infection Outcome: Progressing Goal: Diagnostic test results will improve Outcome: Progressing Goal: Respiratory complications will improve Outcome: Progressing Goal: Cardiovascular complication will be avoided Outcome: Progressing   Problem: Activity: Goal: Risk for activity intolerance will decrease Outcome: Progressing   Problem: Nutrition: Goal: Adequate nutrition will be maintained Outcome: Progressing   Problem: Coping: Goal: Level of anxiety will decrease Outcome: Progressing   Problem: Elimination: Goal: Will not experience complications related to bowel motility Outcome: Progressing Goal: Will not experience complications related to urinary retention Outcome: Progressing   Problem: Pain Managment: Goal: General experience of comfort will improve Outcome: Progressing   Problem: Safety: Goal: Ability to remain free from injury will improve Outcome: Progressing   Problem: Activity: Goal: Ability to ambulate and perform ADLs will improve Outcome: Progressing   Problem: Clinical Measurements: Goal: Postoperative complications will be avoided or minimized Outcome: Progressing   Problem: Self-Concept: Goal: Ability to maintain and perform role responsibilities to the fullest extent possible will improve Outcome: Progressing   Problem: Pain Management: Goal: Pain level will decrease Outcome: Progressing

## 2022-10-07 NOTE — Progress Notes (Signed)
PROGRESS NOTE    Susan Osborne  AVW:[id number] DOB: [birth year] DOA: 10/03/2022 PCP: Theodoro Kos, MD   Brief Narrative: This 89 yo female with the past medical history of dementia, arthritis, hypertension, hypothyroidism, hyperlipidemia, and recurrent urinary tract infections who presented after a mechanical fall. Patient was found down on her kitchen floor, landing on her right side. Apparently she had a fall from her own height while ambulating. It is estimated that she spent about 14 to 16 hrs on the floor before she was found. She was recently diagnosed with urinary tract infection as outpatient. She lives alone and uses a walker for ambulation. She required four people to lift from the floor and get into the care.  In the ED, Her Right leg  was shortened compared to the left.  Right femur radiograph with acute severely displaced and angulated midshaft fracture of the right femur.  Right hand radiograph with moderately displaced and comminuted distal right radial fracture.  Minimally displaced ulnar styloid fracture.  Patient was admitted for further evaluation. Orthopedics was consulted.   Patient underwent ORIF right wrist fracture, ORIF right femur fracture.  POD 2.  Assessment & Plan:   Active Problems:   Closed fracture of shaft of femur (HCC)   Closed right radial fracture   AKI (acute kidney injury) (HCC)   HTN (hypertension)   Hyperlipidemia   Dementia (HCC)   Hypothyroidism   UTI (urinary tract infection)  Closed fracture of shaft of femur , Right [facility]): Traumatic fracture: Plan is to continue supportive medical therapy with analgesics, and DVT prophylaxis. Continue PPI for gi prophylaxis.  Orthopedics consulted. Patient underwent ORIF right femur fracture. POD 2 WBAT RLE for transfers only. PT and OT evaluation.   Closed right radial fracture: Right wrist fracture. Continue immobilization and pain control. Orthopedics consulted. Patient underwent ORIF right Wrist  # POD 2 WBAT through right elbow with platform walker.   AKI (acute kidney injury) (HCC) Rhabdomyolysis. Hyponatremia.  CK is trending down.  Renal functions are improving. Na 134 >135  Continued IV fluids with isotonic saline at 100 cc pr her. Follow up renal functions and electrolytes in am.  Follow up CK(it is trending down).  IV fluid discontinued to avoid fluid overload.   HTN (hypertension): Blood pressure better controlled with amlodipine.   Hyperlipidemia: Hold on atorvastatin for now considering elevated CK.  Possible resumption at the time of discharge.    Dementia [facility]): She has been living alone with supervision of her family. She may need further assistance at the time of her discharge.  No agitation. She has high risk of developing delirium. Continue with donepezil, buspirone, and venlafaxine.    Hypothyroidism: Continue with levothyroxine.  Follow up thyroid nodule as outpatient.    E. Coli UTI (urinary tract infection): Urinary tract infection present on admission with no sepsis.  Continue ceftriaxone.  Urine culture grew E. coli. Follow-up sensitivity.  Normochromic Anemia: H&H dropped from 9.3-7.0 requiring 2 units PRBC. Monitor H&H and transfuse if hemoglobin below 7. Hb improved to 8.5  DVT prophylaxis:  Code Status: Full code Family Communication: No family at bed side. Disposition Plan:   Status is: Inpatient Remains inpatient appropriate because: Admitted s/p mechanical fall with rhabdomyolysis, AKI, right radius fracture and right femur fracture.  Ortho was consulted and underwent ORIF right wrist fracture, ORIF right femur fracture.  POD 2    Consultants:  Orthopaedics  Procedures: ORIF   Antimicrobials:  Anti-infectives (From admission, onward)    Start  Dose/Rate Route Frequency Ordered Stop   10/05/22 1233  vancomycin (VANCOCIN) powder  Status:  Discontinued          As needed 10/05/22 1233 10/05/22 1404   10/05/22 0930   ceFAZolin (ANCEF) IVPB 2g/100 mL premix        2 g 200 mL/hr over 30 Minutes Intravenous On call to O.R. 10/05/22 1610 10/05/22 1232   10/05/22 0840  ceFAZolin (ANCEF) 2-4 GM/100ML-% IVPB       Note to Pharmacy: Darrick Huntsman: cabinet override      10/05/22 0840 10/05/22 1218   10/04/22 1100  cefTRIAXone (ROCEPHIN) 1 g in sodium chloride 0.9 % 100 mL IVPB        1 g 200 mL/hr over 30 Minutes Intravenous Every 24 hours 10/03/22 2012     10/03/22 2015  cefTRIAXone (ROCEPHIN) 1 g in sodium chloride 0.9 % 100 mL IVPB  Status:  Discontinued        1 g 200 mL/hr over 30 Minutes Intravenous Every 24 hours 10/03/22 2011 10/03/22 2012   10/03/22 1715  cefTRIAXone (ROCEPHIN) 2 g in sodium chloride 0.9 % 100 mL IVPB        2 g 200 mL/hr over 30 Minutes Intravenous  Once 10/03/22 1714 10/03/22 1753      Subjective: Patient was seen and examined at bedside. Overnight events noted.   Patient is s/p ORIF POD 2 .  RUE in sling.  Patient reports doing better,  She was having bowel movement.   Objective: Vitals:   10/06/22 1354 10/06/22 1615 10/06/22 2040 10/07/22 0423  BP: (!) 126/42 (!) 142/57 (!) 121/52 133/60  Pulse: 76 79 82 74  Resp: 18 18 18 18   Temp: 98.8 F (37.1 C) 98.8 F (37.1 C) 98.9 F (37.2 C) 98.5 F (36.9 C)  TempSrc:  Oral Oral Oral  SpO2:  90% 93% 93%  Weight:      Height:        Intake/Output Summary (Last 24 hours) at 10/07/2022 1120 Last data filed at 10/06/2022 2004 Gross per 24 hour  Intake 821 ml  Output --  Net 821 ml   Filed Weights   10/03/22 1247 10/05/22 0954  Weight: 81.6 kg 81.6 kg   Examination:  General exam: Appears comfortable, deconditioned, not in any acute distress. Respiratory system: CTA bilaterally. Respiratory effort normal. RR 15 Cardiovascular system: S1 & S2 heard, RRR. No Murmer, No pedal edema. Gastrointestinal system: Abdomen is non distended, soft and non tender. Normal bowel sounds heard. Central nervous system: Alert and  oriented x 1. No focal neurological deficits. Extremities: RUE in dressing, RLE s/p ORIF POD 2, Tenderness  noted. Skin: No rashes, lesions or ulcers Psychiatry: Judgement and insight appear normal. Mood & affect appropriate.   Data Reviewed: I have personally reviewed following labs and imaging studies  CBC: Recent Labs  Lab 10/03/22 1354 10/04/22 0436 10/05/22 0338 10/06/22 0455 10/07/22 0859  WBC 26.5* 22.5* 15.4* 14.6* 14.6*  NEUTROABS  --  18.5* 11.2*  --   --   HGB 9.3* 8.1* 7.0* 7.6* 8.5*  HCT 29.2* 25.8* 22.9* 24.9* 27.7*  MCV 94.8 95.9 94.6 93.6 93.0  PLT 178 168 154 167 190   Basic Metabolic Panel: Recent Labs  Lab 10/03/22 1354 10/04/22 0436 10/05/22 0338 10/06/22 0455  NA 134* 134* 133* 135  K 3.9 4.0 3.7 4.4  CL 102 103 106 109  CO2 21* 19* 18* 20*  GLUCOSE 169* 97 138* 127*  BUN  46* 44* 39* 27*  CREATININE 1.83* 1.48* 1.18* 1.09*  CALCIUM 8.8* 8.2* 7.7* 8.1*  MG  --  2.1  --  2.1  PHOS  --   --   --  3.0   GFR: Estimated Creatinine Clearance: 32.6 mL/min (A) (by C-G formula based on SCr of 1.09 mg/dL (H)). Liver Function Tests: Recent Labs  Lab 10/03/22 1354 10/04/22 0436  AST 74* 70*  ALT 25 30  ALKPHOS 83 69  BILITOT 0.7 1.0  PROT 6.9 6.0*  ALBUMIN 3.5 2.8*   No results for input(s): "LIPASE", "AMYLASE" in the last 168 hours. No results for input(s): "AMMONIA" in the last 168 hours. Coagulation Profile: No results for input(s): "INR", "PROTIME" in the last 168 hours. Cardiac Enzymes: Recent Labs  Lab 10/03/22 1354 10/04/22 0436 10/05/22 0338  CKTOTAL 1,991* 1,138* 489*   BNP (last 3 results) No results for input(s): "PROBNP" in the last 8760 hours. HbA1C: No results for input(s): "HGBA1C" in the last 72 hours. CBG: No results for input(s): "GLUCAP" in the last 168 hours. Lipid Profile: No results for input(s): "CHOL", "HDL", "LDLCALC", "TRIG", "CHOLHDL", "LDLDIRECT" in the last 72 hours. Thyroid Function Tests: No results for  input(s): "TSH", "T4TOTAL", "FREET4", "T3FREE", "THYROIDAB" in the last 72 hours.  Anemia Panel: No results for input(s): "VITAMINB12", "FOLATE", "FERRITIN", "TIBC", "IRON", "RETICCTPCT" in the last 72 hours. Sepsis Labs: No results for input(s): "PROCALCITON", "LATICACIDVEN" in the last 168 hours.  Recent Results (from the past 240 hour(s))  Urine Culture     Status: Abnormal   Collection Time: 10/03/22  4:39 PM   Specimen: Urine, Clean Catch  Result Value Ref Range Status   Specimen Description   Final    URINE, CLEAN CATCH Performed at [facility], [address] [place], [place] [address]    Special Requests   Final    NONE Performed at [facility], [address], [place], [place] [address]    Culture >=100,000 COLONIES/mL ESCHERICHIA COLI (A)  Final   Report Status 10/05/2022 FINAL  Final   Organism ID, Bacteria ESCHERICHIA COLI (A)  Final      Susceptibility   Escherichia coli - MIC*    AMPICILLIN >=32 RESISTANT Resistant     CEFAZOLIN <=4 SENSITIVE Sensitive     CEFEPIME <=0.12 SENSITIVE Sensitive     CEFTRIAXONE <=0.25 SENSITIVE Sensitive     CIPROFLOXACIN >=4 RESISTANT Resistant     GENTAMICIN <=1 SENSITIVE Sensitive     IMIPENEM <=0.25 SENSITIVE Sensitive     NITROFURANTOIN <=16 SENSITIVE Sensitive     TRIMETH/SULFA >=320 RESISTANT Resistant     AMPICILLIN/SULBACTAM 16 INTERMEDIATE Intermediate     PIP/TAZO <=4 SENSITIVE Sensitive     * >=100,000 COLONIES/mL ESCHERICHIA COLI  Surgical pcr screen     Status: Abnormal   Collection Time: 10/05/22  6:33 AM   Specimen: Nasal Mucosa; Nasal Swab  Result Value Ref Range Status   MRSA, PCR NEGATIVE NEGATIVE Final   Staphylococcus aureus POSITIVE (A) NEGATIVE Final    Comment: (NOTE) The Xpert SA Assay (FDA approved for NASAL specimens in patients 55 years of age and older), is one component of a comprehensive surveillance program. It is not intended to diagnose infection nor to guide or monitor treatment. Performed  at [facility], [address], [place], [place] [address]     Radiology Studies: DG Wrist 2 Views Right  Result Date: 10/05/2022 CLINICAL DATA:  ORIF distal radius fracture. EXAM: RIGHT WRIST - 2  VIEW COMPARISON:  Preoperative radiograph. FINDINGS: Volar plate and screw fixation of comminuted distal radius fracture. Improved fracture alignment from preoperative imaging. Overlying splint material limits osseous and soft tissue fine detail. IMPRESSION: Volar plate and screw fixation of comminuted distal radius fracture. Electronically Signed   By: Narda Rutherford M.D.   On: 10/05/2022 16:30   DG FEMUR PORT, MIN 2 VIEWS RIGHT  Result Date: 10/05/2022 CLINICAL DATA:  Femur fracture.  Postop. EXAM: RIGHT FEMUR PORTABLE 2 VIEW COMPARISON:  Preoperative imaging. FINDINGS: Lateral plate and multi screw fixation of femoral shaft fracture. Improved fracture alignment from preoperative exam. Previous proximal femoral hardware with cerclage wire, unchanged appearance of remote proximal femur fracture. Right knee arthroplasty. Recent postsurgical change includes air and edema in the soft tissues. IMPRESSION: ORIF of femoral shaft fracture. No immediate postoperative complication. Electronically Signed   By: Narda Rutherford M.D.   On: 10/05/2022 16:30   DG Wrist Complete Right  Result Date: 10/05/2022 CLINICAL DATA:  ORIF wrist fracture. EXAM: RIGHT WRIST - COMPLETE 3+ VIEW COMPARISON:  Preoperative imaging FINDINGS: Seven fluoroscopic spot views of the right wrist obtained in the operating room. Sequential images during volar plate and screw fixation of distal radius fracture. Fluoroscopy time 33.6 seconds. Dose 0.65 mGy. IMPRESSION: Intraoperative fluoroscopy during ORIF of distal radius fracture. Electronically Signed   By: Narda Rutherford M.D.   On: 10/05/2022 15:45   DG FEMUR, MIN 2 VIEWS RIGHT  Result Date: 10/05/2022 CLINICAL DATA:  Elective surgery. EXAM: RIGHT FEMUR 2 VIEWS COMPARISON:   Preoperative imaging. FINDINGS: Ten fluoroscopic spot views of the right femur obtained in the operating room. New plate and screw fixation of femoral shaft fracture. Previous surgical hardware about the hip and knee. Fluoroscopy time 117 seconds. Dose 15.58 mGy. IMPRESSION: Intraoperative fluoroscopy during ORIF of femoral shaft fracture. Electronically Signed   By: Narda Rutherford M.D.   On: 10/05/2022 15:44   DG C-Arm 1-60 Min-No Report  Result Date: 10/05/2022 Fluoroscopy was utilized by the requesting physician.  No radiographic interpretation.   DG C-Arm 1-60 Min-No Report  Result Date: 10/05/2022 Fluoroscopy was utilized by the requesting physician.  No radiographic interpretation.    Scheduled Meds:  acetaminophen  650 mg Oral Q6H   Or   acetaminophen  650 mg Rectal Q6H   amLODipine  5 mg Oral Daily   busPIRone  10 mg Oral BID   Chlorhexidine Gluconate Cloth  6 each Topical Daily   Chlorhexidine Gluconate Cloth  6 each Topical Q0600   docusate sodium  100 mg Oral BID   donepezil  10 mg Oral QHS   enoxaparin (LOVENOX) injection  40 mg Subcutaneous Q24H   famotidine  20 mg Oral Daily   levETIRAcetam  250 mg Oral BID   levothyroxine  25 mcg Oral Q0600   mupirocin ointment  1 Application Nasal BID   venlafaxine XR  150 mg Oral BID   Vitamin D (Ergocalciferol)  50,000 Units Oral Q7 days   Continuous Infusions:  cefTRIAXone (ROCEPHIN)  IV 1 g (10/07/22 1007)     LOS: 4 days    Time spent: 35 mins    Willeen Niece, MD [facility]   If 7PM-7AM, please contact night-coverage

## 2022-10-08 DIAGNOSIS — S72361A Displaced segmental fracture of shaft of right femur, initial encounter for closed fracture: Secondary | ICD-10-CM

## 2022-10-08 LAB — CBC
HCT: 28.1 % — ABNORMAL LOW (ref 36.0–46.0)
Hemoglobin: 8.8 g/dL — ABNORMAL LOW (ref 12.0–15.0)
MCH: 29 pg (ref 26.0–34.0)
MCHC: 31.3 g/dL (ref 30.0–36.0)
MCV: 92.7 fL (ref 80.0–100.0)
Platelets: 197 10*3/uL (ref 150–400)
RBC: 3.03 MIL/uL — ABNORMAL LOW (ref 3.87–5.11)
RDW: 18.9 % — ABNORMAL HIGH (ref 11.5–15.5)
WBC: 12.8 10*3/uL — ABNORMAL HIGH (ref 4.0–10.5)
nRBC: 0.4 % — ABNORMAL HIGH (ref 0.0–0.2)

## 2022-10-08 LAB — BASIC METABOLIC PANEL
Anion gap: 7 (ref 5–15)
BUN: 17 mg/dL (ref 8–23)
CO2: 20 mmol/L — ABNORMAL LOW (ref 22–32)
Calcium: 8.2 mg/dL — ABNORMAL LOW (ref 8.9–10.3)
Chloride: 110 mmol/L (ref 98–111)
Creatinine, Ser: 0.89 mg/dL (ref 0.44–1.00)
GFR, Estimated: >60 mL/min (ref >60)
Glucose, Bld: 79 mg/dL (ref 70–99)
Potassium: 3.8 mmol/L (ref 3.5–5.1)
Sodium: 137 mmol/L (ref 135–145)

## 2022-10-08 LAB — PHOSPHORUS: Phosphorus: 2.8 mg/dL (ref 2.5–4.6)

## 2022-10-08 LAB — MAGNESIUM: Magnesium: 1.8 mg/dL (ref 1.7–2.4)

## 2022-10-08 NOTE — Plan of Care (Signed)

## 2022-10-08 NOTE — Progress Notes (Signed)
PROGRESS NOTE    Susan Osborne  ZOX:[id number] DOB: 06/11/1927 DOA: 10/03/2022 PCP: Theodoro Kos, MD   Brief Narrative: This 89 yo female with the past medical history of dementia, arthritis, hypertension, hypothyroidism, hyperlipidemia, and recurrent urinary tract infections who presented after a mechanical fall. Patient was found down on her kitchen floor, landing on her right side. Apparently she had a fall from her own height while ambulating. It is estimated that she spent about 14 to 16 hrs on the floor before she was found. She was recently diagnosed with urinary tract infection as outpatient. She lives alone and uses a walker for ambulation. She required four people to lift from the floor and get into the care.  In the ED, Her Right leg  was shortened compared to the left.  Right femur radiograph with acute severely displaced and angulated midshaft fracture of the right femur.  Right hand radiograph with moderately displaced and comminuted distal right radial fracture.  Minimally displaced ulnar styloid fracture.  Patient was admitted for further evaluation. Orthopedics was consulted.   Patient underwent ORIF right wrist fracture, ORIF right femur fracture.  POD 3.  Assessment & Plan:   Active Problems:   Closed fracture of shaft of femur (HCC)   Closed right radial fracture   AKI (acute kidney injury) (HCC)   HTN (hypertension)   Hyperlipidemia   Dementia (HCC)   Hypothyroidism   UTI (urinary tract infection)  Closed fracture of shaft of femur , Right [facility]): Traumatic fracture: Plan is to continue supportive medical therapy with analgesics, and DVT prophylaxis. Continue PPI for gi prophylaxis.  Orthopedics consulted. Patient underwent ORIF right femur fracture. POD 3 WBAT RLE for transfers only. PT and OT evaluation.   Closed right radial fracture: Right wrist fracture. Continue immobilization and pain control. Orthopedics consulted. Patient underwent ORIF right Wrist  # POD 3 WBAT through right elbow with platform walker.   AKI (acute kidney injury) (HCC) Rhabdomyolysis. Hyponatremia.  CK is trending down.  Renal functions back to normal. Na 134 >135    HTN (hypertension): Blood pressure better controlled with amlodipine.   Hyperlipidemia: Hold on atorvastatin for now considering elevated CK.  Possible resumption at the time of discharge.    Dementia [facility]): She has been living alone with supervision of her family. She may need further assistance at the time of her discharge.  No agitation. She has high risk of developing delirium. Continue with donepezil, buspirone, and venlafaxine.    Hypothyroidism: Continue with levothyroxine.  Follow up thyroid nodule as outpatient.    E. Coli UTI (urinary tract infection): Urinary tract infection present on admission with no sepsis.  Continue ceftriaxone.  Urine culture grew E. coli. Can be changed to oral antibiotic at discharge  Normochromic Anemia: H&H dropped from 9.3-7.0 requiring 2 units PRBC. Monitor H&H and transfuse if hemoglobin below 7. Hb improved to 8.5 > 8.8  DVT prophylaxis:  Code Status: Full code Family Communication: No family at bed side. Disposition Plan:   Status is: Inpatient Remains inpatient appropriate because: Admitted s/p mechanical fall with rhabdomyolysis, AKI, right radius fracture and right femur fracture.   Ortho was consulted and underwent ORIF right wrist fracture, ORIF right femur fracture.  POD 3, awaiting SNF placement.    Consultants:  Orthopaedics  Procedures: ORIF   Antimicrobials:  Anti-infectives (From admission, onward)    Start     Dose/Rate Route Frequency Ordered Stop   10/05/22 1233  vancomycin (VANCOCIN) powder  Status:  Discontinued  As needed 10/05/22 1233 10/05/22 1404   10/05/22 0930  ceFAZolin (ANCEF) IVPB 2g/100 mL premix        2 g 200 mL/hr over 30 Minutes Intravenous On call to O.R. 10/05/22 2841 10/05/22 1232    10/05/22 0840  ceFAZolin (ANCEF) 2-4 GM/100ML-% IVPB       Note to Pharmacy: Darrick Huntsman: cabinet override      10/05/22 0840 10/05/22 1218   10/04/22 1100  cefTRIAXone (ROCEPHIN) 1 g in sodium chloride 0.9 % 100 mL IVPB  Status:  Discontinued        1 g 200 mL/hr over 30 Minutes Intravenous Every 24 hours 10/03/22 2012 10/07/22 1320   10/03/22 2015  cefTRIAXone (ROCEPHIN) 1 g in sodium chloride 0.9 % 100 mL IVPB  Status:  Discontinued        1 g 200 mL/hr over 30 Minutes Intravenous Every 24 hours 10/03/22 2011 10/03/22 2012   10/03/22 1715  cefTRIAXone (ROCEPHIN) 2 g in sodium chloride 0.9 % 100 mL IVPB        2 g 200 mL/hr over 30 Minutes Intravenous  Once 10/03/22 1714 10/03/22 1753      Subjective: Patient was seen and examined at bedside.Overnight events noted.   Patient is s/p ORIF POD # 3 RUE in sling.  Patient reports doing better.   Objective: Vitals:   10/07/22 1512 10/07/22 1945 10/08/22 0455 10/08/22 0742  BP: (!) 121/55 (!) 135/55 (!) 123/58 (!) 116/50  Pulse: 75 77 66 73  Resp: 18 15 15 18   Temp: 98 F (36.7 C) 99 F (37.2 C) 98.8 F (37.1 C) 98 F (36.7 C)  TempSrc:   Oral   SpO2: 97% 96% 98% 96%  Weight:      Height:        Intake/Output Summary (Last 24 hours) at 10/08/2022 1142 Last data filed at 10/08/2022 0455 Gross per 24 hour  Intake 120 ml  Output 2250 ml  Net -2130 ml   Filed Weights   10/03/22 1247 10/05/22 0954  Weight: 81.6 kg 81.6 kg   Examination:  General exam: Appears comfortable, deconditioned, not in any acute distress. Respiratory system: CTA bilaterally. Respiratory effort normal. RR 15 Cardiovascular system: S1 & S2 heard, RRR. No Murmer, No pedal edema. Gastrointestinal system: Abdomen is non distended, soft and non tender. Normal bowel sounds heard. Central nervous system: Alert and oriented x 1. No focal neurological deficits. Extremities: RUE in dressing, RLE s/p ORIF POD 3, Tenderness  noted. Skin: No rashes,  lesions or ulcers Psychiatry: Judgement and insight appear normal. Mood & affect appropriate.   Data Reviewed: I have personally reviewed following labs and imaging studies  CBC: Recent Labs  Lab 10/04/22 0436 10/05/22 0338 10/06/22 0455 10/07/22 0859 10/08/22 0430  WBC 22.5* 15.4* 14.6* 14.6* 12.8*  NEUTROABS 18.5* 11.2*  --   --   --   HGB 8.1* 7.0* 7.6* 8.5* 8.8*  HCT 25.8* 22.9* 24.9* 27.7* 28.1*  MCV 95.9 94.6 93.6 93.0 92.7  PLT 168 154 167 190 197   Basic Metabolic Panel: Recent Labs  Lab 10/04/22 0436 10/05/22 0338 10/06/22 0455 10/07/22 0859 10/08/22 0430  NA 134* 133* 135 139 137  K 4.0 3.7 4.4 4.1 3.8  CL 103 106 109 109 110  CO2 19* 18* 20* 22 20*  GLUCOSE 97 138* 127* 90 79  BUN 44* 39* 27* 19 17  CREATININE 1.48* 1.18* 1.09* 0.89 0.89  CALCIUM 8.2* 7.7* 8.1* 8.2* 8.2*  MG  2.1  --  2.1 1.9 1.8  PHOS  --   --  3.0 2.1* 2.8   GFR: Estimated Creatinine Clearance: 40 mL/min (by C-G formula based on SCr of 0.89 mg/dL). Liver Function Tests: Recent Labs  Lab 10/03/22 1354 10/04/22 0436  AST 74* 70*  ALT 25 30  ALKPHOS 83 69  BILITOT 0.7 1.0  PROT 6.9 6.0*  ALBUMIN 3.5 2.8*   No results for input(s): "LIPASE", "AMYLASE" in the last 168 hours. No results for input(s): "AMMONIA" in the last 168 hours. Coagulation Profile: No results for input(s): "INR", "PROTIME" in the last 168 hours. Cardiac Enzymes: Recent Labs  Lab 10/03/22 1354 10/04/22 0436 10/05/22 0338  CKTOTAL 1,991* 1,138* 489*   BNP (last 3 results) No results for input(s): "PROBNP" in the last 8760 hours. HbA1C: No results for input(s): "HGBA1C" in the last 72 hours. CBG: No results for input(s): "GLUCAP" in the last 168 hours. Lipid Profile: No results for input(s): "CHOL", "HDL", "LDLCALC", "TRIG", "CHOLHDL", "LDLDIRECT" in the last 72 hours. Thyroid Function Tests: No results for input(s): "TSH", "T4TOTAL", "FREET4", "T3FREE", "THYROIDAB" in the last 72 hours.  Anemia  Panel: No results for input(s): "VITAMINB12", "FOLATE", "FERRITIN", "TIBC", "IRON", "RETICCTPCT" in the last 72 hours. Sepsis Labs: No results for input(s): "PROCALCITON", "LATICACIDVEN" in the last 168 hours.  Recent Results (from the past 240 hour(s))  Urine Culture     Status: Abnormal   Collection Time: 10/03/22  4:39 PM   Specimen: Urine, Clean Catch  Result Value Ref Range Status   Specimen Description   Final    URINE, CLEAN CATCH Performed at [facility], [address], [place], [place] [address]    Special Requests   Final    NONE Performed at [facility], [address], [place], [place] [address]    Culture >=100,000 COLONIES/mL ESCHERICHIA COLI (A)  Final   Report Status 10/05/2022 FINAL  Final   Organism ID, Bacteria ESCHERICHIA COLI (A)  Final      Susceptibility   Escherichia coli - MIC*    AMPICILLIN >=32 RESISTANT Resistant     CEFAZOLIN <=4 SENSITIVE Sensitive     CEFEPIME <=0.12 SENSITIVE Sensitive     CEFTRIAXONE <=0.25 SENSITIVE Sensitive     CIPROFLOXACIN >=4 RESISTANT Resistant     GENTAMICIN <=1 SENSITIVE Sensitive     IMIPENEM <=0.25 SENSITIVE Sensitive     NITROFURANTOIN <=16 SENSITIVE Sensitive     TRIMETH/SULFA >=320 RESISTANT Resistant     AMPICILLIN/SULBACTAM 16 INTERMEDIATE Intermediate     PIP/TAZO <=4 SENSITIVE Sensitive     * >=100,000 COLONIES/mL ESCHERICHIA COLI  Surgical pcr screen     Status: Abnormal   Collection Time: 10/05/22  6:33 AM   Specimen: Nasal Mucosa; Nasal Swab  Result Value Ref Range Status   MRSA, PCR NEGATIVE NEGATIVE Final   Staphylococcus aureus POSITIVE (A) NEGATIVE Final    Comment: (NOTE) The Xpert SA Assay (FDA approved for NASAL specimens in patients 67 years of age and older), is one component of a comprehensive surveillance program. It is not intended to diagnose infection nor to guide or monitor treatment. Performed at [facility], [address] [place], [place] [address]     Radiology  Studies: No results found.  Scheduled Meds:  acetaminophen  650 mg Oral Q6H   Or   acetaminophen  650 mg Rectal Q6H   amLODipine  5 mg Oral Daily   busPIRone  10 mg Oral BID   Chlorhexidine  Gluconate Cloth  6 each Topical Daily   Chlorhexidine Gluconate Cloth  6 each Topical Q0600   dipyridamole-aspirin  1 capsule Oral BID   docusate sodium  100 mg Oral BID   donepezil  10 mg Oral QHS   enoxaparin (LOVENOX) injection  40 mg Subcutaneous Q24H   famotidine  20 mg Oral Daily   levETIRAcetam  250 mg Oral BID   levothyroxine  25 mcg Oral Q0600   mupirocin ointment  1 Application Nasal BID   venlafaxine XR  150 mg Oral BID   Vitamin D (Ergocalciferol)  50,000 Units Oral Q7 days   Continuous Infusions:   LOS: 5 days   Time spent: 35 mins  Willeen Niece, MD [facility]   If 7PM-7AM, please Awaiting SNF placementcontact night-coverage

## 2022-10-09 DIAGNOSIS — S7291XA Unspecified fracture of right femur, initial encounter for closed fracture: Secondary | ICD-10-CM

## 2022-10-09 LAB — CBC
HCT: 28.6 % — ABNORMAL LOW (ref 36.0–46.0)
Hemoglobin: 8.9 g/dL — ABNORMAL LOW (ref 12.0–15.0)
MCH: 29.4 pg (ref 26.0–34.0)
MCHC: 31.1 g/dL (ref 30.0–36.0)
MCV: 94.4 fL (ref 80.0–100.0)
Platelets: 229 10*3/uL (ref 150–400)
RBC: 3.03 MIL/uL — ABNORMAL LOW (ref 3.87–5.11)
RDW: 18.4 % — ABNORMAL HIGH (ref 11.5–15.5)
WBC: 13 10*3/uL — ABNORMAL HIGH (ref 4.0–10.5)
nRBC: 0.3 % — ABNORMAL HIGH (ref 0.0–0.2)

## 2022-10-09 LAB — CK: Total CK: 113 U/L (ref 38–234)

## 2022-10-09 NOTE — Plan of Care (Signed)

## 2022-10-09 NOTE — Progress Notes (Signed)
PROGRESS NOTE    Susan Osborne  UJW:[id number] DOB: Mar 14, 1927 DOA: 10/03/2022 PCP: Theodoro Kos, MD   Brief Narrative: This 89 yo female with the past medical history of dementia, arthritis, hypertension, hypothyroidism, hyperlipidemia, and recurrent urinary tract infections who presented after a mechanical fall. Patient was found down on her kitchen floor, landing on her right side. Apparently she had a fall from her own height while ambulating. It is estimated that she spent about 14 to 16 hrs on the floor before she was found. She was recently diagnosed with urinary tract infection as outpatient. She lives alone and uses a walker for ambulation. She required four people to lift from the floor and get into the care.  In the ED, Her Right leg  was shortened compared to the left.  Right femur radiograph with acute severely displaced and angulated midshaft fracture of the right femur.  Right hand radiograph with moderately displaced and comminuted distal right radial fracture.  Minimally displaced ulnar styloid fracture.  Patient was admitted for further evaluation. Orthopedics was consulted.   Patient underwent ORIF right wrist fracture, ORIF right femur fracture.  POD 3.  Assessment & Plan:   Active Problems:   Closed fracture of shaft of femur (HCC)   Closed right radial fracture   AKI (acute kidney injury) (HCC)   HTN (hypertension)   Hyperlipidemia   Dementia (HCC)   Hypothyroidism   UTI (urinary tract infection)  Closed fracture of shaft of femur , Right [facility]): Traumatic fracture: Plan is to continue supportive medical therapy with analgesics, and DVT prophylaxis. Continue PPI for gi prophylaxis.  Orthopedics consulted. Patient underwent ORIF right femur fracture. POD 4 WBAT RLE for transfers only. PT and OT evaluation> SNF pending insurance   Closed right radial fracture: Right wrist fracture. Continue immobilization and pain control. Orthopedics consulted. Patient  underwent ORIF right Wrist # POD 4 WBAT through right elbow with platform walker.   AKI (acute kidney injury) (HCC) Rhabdomyolysis. Hyponatremia.  CK is trending down.  Renal functions back to normal. Na 134 >135    HTN (hypertension): Blood pressure better controlled with amlodipine.   Hyperlipidemia: Hold on atorvastatin for now considering elevated CK.  Possible resumption at the time of discharge.    Dementia [facility]): She has been living alone with supervision of her family. She may need further assistance at the time of her discharge.  No agitation. She has high risk of developing delirium. Continue with donepezil, buspirone, and venlafaxine.    Hypothyroidism: Continue with levothyroxine.  Follow up thyroid nodule as outpatient.    E. Coli UTI (urinary tract infection): Urinary tract infection present on admission with no sepsis.  Continue ceftriaxone.  Urine culture grew E. coli. Can be changed to oral antibiotic at discharge  Normochromic Anemia: H&H dropped from 9.3-7.0 requiring 2 units PRBC. Monitor H&H and transfuse if hemoglobin below 7. Hb improved to 8.5 > 8.8 > 8.9  DVT prophylaxis:  Code Status: Full code Family Communication: No family at bed side. Disposition Plan:   Status is: Inpatient Remains inpatient appropriate because: Admitted s/p mechanical fall with rhabdomyolysis, AKI, right radius fracture and right femur fracture.   Ortho was consulted and underwent ORIF right wrist fracture, ORIF right femur fracture.  POD 4, awaiting SNF placement.    Consultants:  Orthopaedics  Procedures: ORIF   Antimicrobials:  Anti-infectives (From admission, onward)    Start     Dose/Rate Route Frequency Ordered Stop   10/05/22 1233  vancomycin (VANCOCIN)  powder  Status:  Discontinued          As needed 10/05/22 1233 10/05/22 1404   10/05/22 0930  ceFAZolin (ANCEF) IVPB 2g/100 mL premix        2 g 200 mL/hr over 30 Minutes Intravenous On call to O.R.  10/05/22 8295 10/05/22 1232   10/05/22 0840  ceFAZolin (ANCEF) 2-4 GM/100ML-% IVPB       Note to Pharmacy: Darrick Huntsman: cabinet override      10/05/22 0840 10/05/22 1218   10/04/22 1100  cefTRIAXone (ROCEPHIN) 1 g in sodium chloride 0.9 % 100 mL IVPB  Status:  Discontinued        1 g 200 mL/hr over 30 Minutes Intravenous Every 24 hours 10/03/22 2012 10/07/22 1320   10/03/22 2015  cefTRIAXone (ROCEPHIN) 1 g in sodium chloride 0.9 % 100 mL IVPB  Status:  Discontinued        1 g 200 mL/hr over 30 Minutes Intravenous Every 24 hours 10/03/22 2011 10/03/22 2012   10/03/22 1715  cefTRIAXone (ROCEPHIN) 2 g in sodium chloride 0.9 % 100 mL IVPB        2 g 200 mL/hr over 30 Minutes Intravenous  Once 10/03/22 1714 10/03/22 1753      Subjective: Patient was seen and examined at bedside.Overnight events noted.   Patient reports doing much improved. She is status post ORIF POD 4. She reports itching in the right arm.    Objective: Vitals:   10/08/22 0742 10/08/22 1531 10/08/22 2141 10/09/22 0826  BP: (!) 116/50 (!) 123/50 (!) 136/53 (!) 127/53  Pulse: 73 68 73 79  Resp: 18 18 18 18   Temp: 98 F (36.7 C) 98 F (36.7 C) 98.9 F (37.2 C) 98.5 F (36.9 C)  TempSrc:   Oral Oral  SpO2: 96% 97% 99% 96%  Weight:      Height:        Intake/Output Summary (Last 24 hours) at 10/09/2022 1105 Last data filed at 10/09/2022 0841 Gross per 24 hour  Intake --  Output 4375 ml  Net -4375 ml   Filed Weights   10/03/22 1247 10/05/22 0954  Weight: 81.6 kg 81.6 kg   Examination:  General exam: Appears comfortable, deconditioned, not in any acute distress. Respiratory system: CTA bilaterally. Respiratory effort normal. RR 14 Cardiovascular system: S1 & S2 heard, RRR. No Murmer, No pedal edema. Gastrointestinal system: Abdomen is non distended, soft and non tender. Normal bowel sounds heard. Central nervous system: Alert and oriented x 1. No focal neurological deficits. Extremities: RUE in  dressing, RLE s/p ORIF POD 4, Tenderness  noted. Skin: No rashes, lesions or ulcers Psychiatry: Judgement and insight appear normal. Mood & affect appropriate.   Data Reviewed: I have personally reviewed following labs and imaging studies  CBC: Recent Labs  Lab 10/04/22 0436 10/05/22 0338 10/06/22 0455 10/07/22 0859 10/08/22 0430 10/09/22 0445  WBC 22.5* 15.4* 14.6* 14.6* 12.8* 13.0*  NEUTROABS 18.5* 11.2*  --   --   --   --   HGB 8.1* 7.0* 7.6* 8.5* 8.8* 8.9*  HCT 25.8* 22.9* 24.9* 27.7* 28.1* 28.6*  MCV 95.9 94.6 93.6 93.0 92.7 94.4  PLT 168 154 167 190 197 229   Basic Metabolic Panel: Recent Labs  Lab 10/04/22 0436 10/05/22 0338 10/06/22 0455 10/07/22 0859 10/08/22 0430  NA 134* 133* 135 139 137  K 4.0 3.7 4.4 4.1 3.8  CL 103 106 109 109 110  CO2 19* 18* 20* 22 20*  GLUCOSE  97 138* 127* 90 79  BUN 44* 39* 27* 19 17  CREATININE 1.48* 1.18* 1.09* 0.89 0.89  CALCIUM 8.2* 7.7* 8.1* 8.2* 8.2*  MG 2.1  --  2.1 1.9 1.8  PHOS  --   --  3.0 2.1* 2.8   GFR: Estimated Creatinine Clearance: 40 mL/min (by C-G formula based on SCr of 0.89 mg/dL). Liver Function Tests: Recent Labs  Lab 10/03/22 1354 10/04/22 0436  AST 74* 70*  ALT 25 30  ALKPHOS 83 69  BILITOT 0.7 1.0  PROT 6.9 6.0*  ALBUMIN 3.5 2.8*   No results for input(s): "LIPASE", "AMYLASE" in the last 168 hours. No results for input(s): "AMMONIA" in the last 168 hours. Coagulation Profile: No results for input(s): "INR", "PROTIME" in the last 168 hours. Cardiac Enzymes: Recent Labs  Lab 10/03/22 1354 10/04/22 0436 10/05/22 0338  CKTOTAL 1,991* 1,138* 489*   BNP (last 3 results) No results for input(s): "PROBNP" in the last 8760 hours. HbA1C: No results for input(s): "HGBA1C" in the last 72 hours. CBG: No results for input(s): "GLUCAP" in the last 168 hours. Lipid Profile: No results for input(s): "CHOL", "HDL", "LDLCALC", "TRIG", "CHOLHDL", "LDLDIRECT" in the last 72 hours. Thyroid Function  Tests: No results for input(s): "TSH", "T4TOTAL", "FREET4", "T3FREE", "THYROIDAB" in the last 72 hours.  Anemia Panel: No results for input(s): "VITAMINB12", "FOLATE", "FERRITIN", "TIBC", "IRON", "RETICCTPCT" in the last 72 hours. Sepsis Labs: No results for input(s): "PROCALCITON", "LATICACIDVEN" in the last 168 hours.  Recent Results (from the past 240 hour(s))  Urine Culture     Status: Abnormal   Collection Time: 10/03/22  4:39 PM   Specimen: Urine, Clean Catch  Result Value Ref Range Status   Specimen Description   Final    URINE, CLEAN CATCH Performed at [facility], [address], [place], [place] [address]    Special Requests   Final    NONE Performed at [facility], [address], [place], [place] [address]    Culture >=100,000 COLONIES/mL ESCHERICHIA COLI (A)  Final   Report Status 10/05/2022 FINAL  Final   Organism ID, Bacteria ESCHERICHIA COLI (A)  Final      Susceptibility   Escherichia coli - MIC*    AMPICILLIN >=32 RESISTANT Resistant     CEFAZOLIN <=4 SENSITIVE Sensitive     CEFEPIME <=0.12 SENSITIVE Sensitive     CEFTRIAXONE <=0.25 SENSITIVE Sensitive     CIPROFLOXACIN >=4 RESISTANT Resistant     GENTAMICIN <=1 SENSITIVE Sensitive     IMIPENEM <=0.25 SENSITIVE Sensitive     NITROFURANTOIN <=16 SENSITIVE Sensitive     TRIMETH/SULFA >=320 RESISTANT Resistant     AMPICILLIN/SULBACTAM 16 INTERMEDIATE Intermediate     PIP/TAZO <=4 SENSITIVE Sensitive     * >=100,000 COLONIES/mL ESCHERICHIA COLI  Surgical pcr screen     Status: Abnormal   Collection Time: 10/05/22  6:33 AM   Specimen: Nasal Mucosa; Nasal Swab  Result Value Ref Range Status   MRSA, PCR NEGATIVE NEGATIVE Final   Staphylococcus aureus POSITIVE (A) NEGATIVE Final    Comment: (NOTE) The Xpert SA Assay (FDA approved for NASAL specimens in patients 79 years of age and older), is one component of a comprehensive surveillance program. It is not intended to diagnose infection nor to guide or monitor  treatment. Performed at [facility], [address] [place], [place] [address]     Radiology Studies: No results found.  Scheduled Meds:  acetaminophen  650 mg Oral Q6H  Or   acetaminophen  650 mg Rectal Q6H   amLODipine  5 mg Oral Daily   busPIRone  10 mg Oral BID   Chlorhexidine Gluconate Cloth  6 each Topical Daily   Chlorhexidine Gluconate Cloth  6 each Topical Q0600   dipyridamole-aspirin  1 capsule Oral BID   docusate sodium  100 mg Oral BID   donepezil  10 mg Oral QHS   enoxaparin (LOVENOX) injection  40 mg Subcutaneous Q24H   famotidine  20 mg Oral Daily   levETIRAcetam  250 mg Oral BID   levothyroxine  25 mcg Oral Q0600   mupirocin ointment  1 Application Nasal BID   venlafaxine XR  150 mg Oral BID   Vitamin D (Ergocalciferol)  50,000 Units Oral Q7 days   Continuous Infusions:   LOS: 6 days   Time spent: 35 mins  Willeen Niece, MD [facility]   If 7PM-7AM, please Awaiting SNF placementcontact night-coverage

## 2022-10-10 ENCOUNTER — Encounter (HOSPITAL_COMMUNITY): Payer: Self-pay | Admitting: Student

## 2022-10-10 DIAGNOSIS — S72141S Displaced intertrochanteric fracture of right femur, sequela: Secondary | ICD-10-CM | POA: Diagnosis not present

## 2022-10-10 LAB — MAGNESIUM: Magnesium: 1.8 mg/dL (ref 1.7–2.4)

## 2022-10-10 LAB — BASIC METABOLIC PANEL
Anion gap: 11 (ref 5–15)
BUN: 19 mg/dL (ref 8–23)
CO2: 23 mmol/L (ref 22–32)
Calcium: 8.5 mg/dL — ABNORMAL LOW (ref 8.9–10.3)
Chloride: 104 mmol/L (ref 98–111)
Creatinine, Ser: 1.04 mg/dL — ABNORMAL HIGH (ref 0.44–1.00)
GFR, Estimated: 50 mL/min — ABNORMAL LOW (ref >60)
Glucose, Bld: 96 mg/dL (ref 70–99)
Potassium: 4.3 mmol/L (ref 3.5–5.1)
Sodium: 138 mmol/L (ref 135–145)

## 2022-10-10 LAB — CBC
HCT: 27.9 % — ABNORMAL LOW (ref 36.0–46.0)
Hemoglobin: 8.6 g/dL — ABNORMAL LOW (ref 12.0–15.0)
MCH: 28.7 pg (ref 26.0–34.0)
MCHC: 30.8 g/dL (ref 30.0–36.0)
MCV: 93 fL (ref 80.0–100.0)
Platelets: 238 10*3/uL (ref 150–400)
RBC: 3 MIL/uL — ABNORMAL LOW (ref 3.87–5.11)
RDW: 18.8 % — ABNORMAL HIGH (ref 11.5–15.5)
WBC: 11.8 10*3/uL — ABNORMAL HIGH (ref 4.0–10.5)
nRBC: 0.3 % — ABNORMAL HIGH (ref 0.0–0.2)

## 2022-10-10 LAB — PHOSPHORUS: Phosphorus: 3.3 mg/dL (ref 2.5–4.6)

## 2022-10-10 LAB — GLUCOSE, CAPILLARY: Glucose-Capillary: 151 mg/dL — ABNORMAL HIGH (ref 70–99)

## 2022-10-10 MED ORDER — DOCUSATE SODIUM 100 MG PO CAPS: 100.0000 mg | ORAL_CAPSULE | Freq: Two times a day (BID) | ORAL | 0 refills | Status: AC

## 2022-10-10 NOTE — Discharge Summary (Signed)
Physician Discharge Summary  Susan Osborne WJX:[id number] DOB: September 11, 1927 DOA: 10/03/2022  PCP: Theodoro Kos, MD  Admit date: 10/03/2022  Discharge date: 10/10/2022  Admitted From: Home.  Disposition:  [facility]  Recommendations for Outpatient Follow-up:  Follow up with PCP in 1-2 weeks. Please obtain BMP/CBC in one week. Advised to follow-up with orthopedics Dr. Jena Gauss in 2 weeks. Advised to follow up instructions as recommended by orthopedics.. Continue Eliquis 5 mg twice daily for DVT prophylaxis.  Home Health: None Equipment/Devices:None  Discharge Condition: Stable CODE STATUS:Full code Diet recommendation: Heart Healthy   Brief [facility] Course: This 89 yo female with the past medical history of dementia, arthritis, hypertension, hypothyroidism, hyperlipidemia, and recurrent urinary tract infections who presented after a mechanical fall. Patient was found down on her kitchen floor, landing on her right side. Apparently she had a fall from her own height while ambulating. It is estimated that she spent about 14 to 16 hrs on the floor before she was found. She was recently diagnosed with urinary tract infection as outpatient. She lives alone and uses a walker for ambulation. She required four people to lift from the floor and get into the care.  In the ED, Her Right leg was shortened compared to the left.  Right femur radiograph with acute severely displaced and angulated midshaft fracture of the right femur.  Right hand radiograph with moderately displaced and comminuted distal right radial fracture.  Minimally displaced ulnar styloid fracture.  Patient was admitted for further evaluation.Orthopedics was consulted. Patient underwent ORIF right wrist fracture, ORIF right femur fracture. POD 4. CK level normalized. Patient participated with physical therapy,  recommended SNF. Patient has been doing great. She reports pain is reasonably controlled. She wants to be  discharged.  Orthopedics signed off, insurance authorization approved.  Patient being discharged to SNF for rehab.  Discharge Diagnoses:  Active Problems:   Closed fracture of shaft of femur (HCC)   Closed right radial fracture   AKI (acute kidney injury) (HCC)   HTN (hypertension)   Hyperlipidemia   Dementia (HCC)   Hypothyroidism   UTI (urinary tract infection)  Closed fracture of shaft of femur , Right [facility]): Traumatic fracture: Orthopedics consulted. Patient underwent ORIF right femur fracture. POD 5 WBAT RLE for transfers only. PT and OT evaluation> SNF insurance approved.   Closed right radial fracture: Right wrist fracture. Continue immobilization and pain control. Orthopedics consulted. Patient underwent ORIF right Wrist # POD 5 WBAT through right elbow with platform walker. Orthopedics signed off.  Patient is being discharged to SNF.   AKI (acute kidney injury) (HCC) Rhabdomyolysis. Hyponatremia.  > Resolved. CK normalized.  Renal functions back to normal. Na 134 >135    HTN (hypertension): Blood pressure better controlled with amlodipine.   Hyperlipidemia: Resume atorvastatin since CK level normalized.   Dementia [facility]): She has been living alone with supervision of her family. She may need further assistance at the time of her discharge.  No agitation. She has high risk of developing delirium. Continue with donepezil, buspirone, and venlafaxine.    Hypothyroidism: Continue with levothyroxine.  Follow up thyroid nodule as outpatient.    E. Coli UTI (urinary tract infection): Urinary tract infection present on admission with no sepsis.  Continue ceftriaxone.  Urine culture grew E. coli. She completed course of antibiotics for UTI.   Normochromic Anemia: H&H dropped from 9.3-7.0 requiring 2 units PRBC. Monitor H&H and transfuse if hemoglobin below 7. Hb improved to 8.5 > 8.8 > 8.9 >  8.6  Discharge Instructions  Discharge Instructions     Call MD  for:  difficulty breathing, headache or visual disturbances   Complete by: As directed    Call MD for:  persistant dizziness or light-headedness   Complete by: As directed    Call MD for:  persistant nausea and vomiting   Complete by: As directed    Diet - low sodium heart healthy   Complete by: As directed    Diet Carb Modified   Complete by: As directed    Discharge instructions   Complete by: As directed    Advised to follow-up with primary care physician in 1 week. Advised to follow-up with orthopedics Dr. Lorra Hals in 2 weeks. Advised to follow up instructions as recommended by orthopedics. Continue Eliquis 5 mg twice daily for DVT prophylaxis   Increase activity slowly   Complete by: As directed       Allergies as of 10/10/2022       Reactions   Codeine Anxiety, Nausea And Vomiting   "makes me crazy"        Medication List     STOP taking these medications    HYDROcodone-acetaminophen 5-325 MG tablet Commonly known as: NORCO/VICODIN       TAKE these medications    albuterol (2.5 MG/3ML) 0.083% nebulizer solution Commonly known as: PROVENTIL Take 2.5 mg by nebulization every 8 (eight) hours as needed for wheezing or shortness of breath.   albuterol 108 (90 Base) MCG/ACT inhaler Commonly known as: VENTOLIN HFA Inhale 2 puffs into the lungs every 4 (four) hours as needed for wheezing or shortness of breath.   amLODipine 5 MG tablet Commonly known as: NORVASC Take 5 mg by mouth daily.   apixaban 2.5 MG Tabs tablet Commonly known as: Eliquis Take 1 tablet (2.5 mg total) by mouth 2 (two) times daily.   atorvastatin 40 MG tablet Commonly known as: LIPITOR Take 40 mg by mouth at bedtime.   busPIRone 10 MG tablet Commonly known as: BUSPAR Take 20 mg by mouth 2 (two) times daily.   Calcium 500 + D 500-125 MG-UNIT tablet Generic drug: Calcium Carb-Cholecalciferol Take 1 tablet by mouth daily.   dipyridamole-aspirin 200-25 MG 12hr capsule Commonly known  as: AGGRENOX Take 1 capsule by mouth 2 (two) times daily after a meal.   docusate sodium 100 MG capsule Commonly known as: COLACE Take 1 capsule (100 mg total) by mouth 2 (two) times daily.   donepezil 10 MG tablet Commonly known as: ARICEPT Take 10 mg by mouth at bedtime.   famotidine 40 MG tablet Commonly known as: PEPCID Take 40 mg by mouth daily.   levETIRAcetam 250 MG tablet Commonly known as: KEPPRA Take 250 mg by mouth 2 (two) times daily.   levothyroxine 25 MCG tablet Commonly known as: SYNTHROID Take 25 mcg by mouth daily before breakfast.   nitrofurantoin (macrocrystal-monohydrate) 100 MG capsule Commonly known as: MACROBID Take 100 mg by mouth 2 (two) times daily.   omeprazole 40 MG capsule Commonly known as: PRILOSEC Take 40 mg by mouth every morning.   oxyCODONE 5 MG immediate release tablet Commonly known as: Oxy IR/ROXICODONE Take 1 tablet (5 mg total) by mouth every 4 (four) hours as needed for moderate pain.   QUEtiapine 25 MG tablet Commonly known as: SEROQUEL Take 25 mg by mouth at bedtime.   venlafaxine XR 150 MG 24 hr capsule Commonly known as: EFFEXOR-XR Take 150 mg by mouth in the morning and at bedtime.  Vitamin D (Ergocalciferol) 1.25 MG (50000 UNIT) Caps capsule Commonly known as: DRISDOL Take 1 capsule (50,000 Units total) by mouth every 7 (seven) days. Start taking on: October 12, 2022        Contact information for follow-up providers     Theodoro Kos, MD Follow up in 1 week(s).   Specialty: Internal Medicine Contact information: [address] [place] [address] [phone]         Roby Lofts, MD Follow up in 2 week(s).   Specialty: Orthopedic Surgery Contact information: [address] [place] [address] [phone]              Contact information for after-discharge care     Destination     [facility] .   Service: Skilled Nursing Contact information: [address] [place] [address] [phone]                    Allergies  Allergen Reactions   Codeine Anxiety and Nausea And Vomiting    "makes me crazy"    Consultations: Orthopeadics   Procedures/Studies: DG Wrist 2 Views Right  Result Date: 10/05/2022 CLINICAL DATA:  ORIF distal radius fracture. EXAM: RIGHT WRIST - 2 VIEW COMPARISON:  Preoperative radiograph. FINDINGS: Volar plate and screw fixation of comminuted distal radius fracture. Improved fracture alignment from preoperative imaging. Overlying splint material limits osseous and soft tissue fine detail. IMPRESSION: Volar plate and screw fixation of comminuted distal radius fracture. Electronically Signed   By: Narda Rutherford M.D.   On: 10/05/2022 16:30   DG FEMUR PORT, MIN 2 VIEWS RIGHT  Result Date: 10/05/2022 CLINICAL DATA:  Femur fracture.  Postop. EXAM: RIGHT FEMUR PORTABLE 2 VIEW COMPARISON:  Preoperative imaging. FINDINGS: Lateral plate and multi screw fixation of femoral shaft fracture. Improved fracture alignment from preoperative exam. Previous proximal femoral hardware with cerclage wire, unchanged appearance of remote proximal femur fracture. Right knee arthroplasty. Recent postsurgical change includes air and edema in the soft tissues. IMPRESSION: ORIF of femoral shaft fracture. No immediate postoperative complication. Electronically Signed   By: Narda Rutherford M.D.   On: 10/05/2022 16:30   DG Wrist Complete Right  Result Date: 10/05/2022 CLINICAL DATA:  ORIF wrist fracture. EXAM: RIGHT WRIST - COMPLETE 3+ VIEW COMPARISON:  Preoperative imaging FINDINGS: Seven fluoroscopic spot views of the right wrist obtained in the operating room. Sequential images during volar plate and screw fixation of distal radius fracture. Fluoroscopy time 33.6 seconds. Dose 0.65 mGy. IMPRESSION: Intraoperative fluoroscopy during ORIF of distal radius fracture. Electronically Signed   By: Narda Rutherford M.D.   On:  10/05/2022 15:45   DG FEMUR, MIN 2 VIEWS RIGHT  Result Date: 10/05/2022 CLINICAL DATA:  Elective surgery. EXAM: RIGHT FEMUR 2 VIEWS COMPARISON:  Preoperative imaging. FINDINGS: Ten fluoroscopic spot views of the right femur obtained in the operating room. New plate and screw fixation of femoral shaft fracture. Previous surgical hardware about the hip and knee. Fluoroscopy time 117 seconds. Dose 15.58 mGy. IMPRESSION: Intraoperative fluoroscopy during ORIF of femoral shaft fracture. Electronically Signed   By: Narda Rutherford M.D.   On: 10/05/2022 15:44   DG C-Arm 1-60 Min-No Report  Result Date: 10/05/2022 Fluoroscopy was utilized by the requesting physician.  No radiographic interpretation.   DG C-Arm 1-60 Min-No Report  Result Date: 10/05/2022 Fluoroscopy was utilized by the requesting physician.  No radiographic interpretation.   DG Wrist Complete Right  Result Date: 10/03/2022 CLINICAL DATA:  Post reduction EXAM: RIGHT WRIST - COMPLETE 3+ VIEW COMPARISON:  10/03/2022 FINDINGS: Casting material limits bone detail. Bones appear demineralized. Acute comminuted intra-articular distal radius fracture with impaction. Residual dorsal angulation of distal fracture fragment not much changed in the interval. Similar alignment of minimally displaced ulnar styloid fracture IMPRESSION: Similar alignment of distal radius and ulnar styloid fractures. Electronically Signed   By: Jasmine Pang M.D.   On: 10/03/2022 22:49   DG Ribs Unilateral W/Chest Right  Result Date: 10/03/2022 CLINICAL DATA:  Post fall, now with right-sided rib pain. EXAM: RIGHT RIBS AND CHEST - 3+ VIEW COMPARISON:  06/18/2020 FINDINGS: Unchanged enlarged cardiac silhouette and mediastinal contours with atherosclerotic plaque within thoracic aorta. There is persistent thickening the right paratracheal stripe presumably secondary to prominent vasculature. No discrete focal airspace opacities. No pleural effusion or pneumothorax no evidence  of edema. No acute osseous abnormalities. Specifically, no definite displaced right-sided rib fractures. Moderate to severe degenerative change of the right glenohumeral joint with joint space loss, subchondral sclerosis and inferiorly directed osteophytosis. IMPRESSION: 1. No acute cardiopulmonary disease. 2. No definite displaced right-sided rib fractures. 3. Moderate to severe degenerative change of the right glenohumeral joint. Electronically Signed   By: Simonne Come M.D.   On: 10/03/2022 17:07   CT Cervical Spine Wo Contrast  Result Date: 10/03/2022 CLINICAL DATA:  Neck trauma (Age >= 65y) EXAM: CT CERVICAL SPINE WITHOUT CONTRAST TECHNIQUE: Multidetector CT imaging of the cervical spine was performed without intravenous contrast. Multiplanar CT image reconstructions were also generated. RADIATION DOSE REDUCTION: This exam was performed according to the departmental dose-optimization program which includes automated exposure control, adjustment of the mA and/or kV according to patient size and/or use of iterative reconstruction technique. COMPARISON:  CTA neck 07/21/2020 FINDINGS: Alignment: Mild anterolisthesis of C3 on C4, C4 on C5, C5 on C6, and C7 on T1, most likely degenerative given degenerative changes at these levels and given similar alignment on prior CTA neck. Skull base and vertebrae: No acute fracture. No primary bone lesion or focal pathologic process. Soft tissues and spinal canal: No prevertebral fluid or swelling. No visible canal hematoma. Disc levels:  Moderate multilevel degenerative change. Upper chest: Visualized lung apices are clear. IMPRESSION: 1. No evidence of acute fracture or traumatic malalignment. 2. Large (4.5 cm) right thyroid nodule. Recommend thyroid ultrasound (ref: J Am Coll Radiol. 2015 Feb;12(2): 143-50). Electronically Signed   By: Feliberto Harts M.D.   On: 10/03/2022 16:40   CT Head Wo Contrast  Result Date: 10/03/2022 CLINICAL DATA:  Trauma EXAM: CT HEAD  WITHOUT CONTRAST TECHNIQUE: Contiguous axial images were obtained from the base of the skull through the vertex without intravenous contrast. RADIATION DOSE REDUCTION: This exam was performed according to the departmental dose-optimization program which includes automated exposure control, adjustment of the mA and/or kV according to patient size and/or use of iterative reconstruction technique. COMPARISON:  CT head 07/21/2020.  MRI brain 07/22/2020. FINDINGS: Brain: No evidence of acute infarction, hemorrhage, hydrocephalus, extra-axial collection or mass lesion/mass effect. Again seen is mild diffuse atrophy and mild periventricular white matter hypodensity, likely chronic small vessel ischemic change. Vascular: Atherosclerotic calcifications are present within the cavernous internal carotid arteries. Skull: Normal. Negative for fracture or focal lesion. Sinuses/Orbits: No acute finding. Other: None. IMPRESSION: 1. No acute intracranial abnormality. 2. Mild diffuse atrophy and mild chronic small vessel ischemic change. Electronically Signed   By: Darliss Cheney M.D.   On: 10/03/2022 16:36   DG Pelvis 1-2 Views  Result Date:  10/03/2022 CLINICAL DATA:  Right femur pain after fall today. EXAM: PELVIS - 1-2 VIEW COMPARISON:  None Available. FINDINGS: Status post surgical internal fixation of old unhealed proximal right femoral shaft fracture. No acute fracture or dislocation is noted. IMPRESSION: No acute abnormality seen. Electronically Signed   By: Lupita Raider M.D.   On: 10/03/2022 15:19   DG FEMUR, MIN 2 VIEWS RIGHT  Result Date: 10/03/2022 CLINICAL DATA:  Right femur pain after fall today. EXAM: RIGHT FEMUR 2 VIEWS COMPARISON:  July 21, 2020. FINDINGS: Status post surgical internal fixation of old proximal right femoral shaft fracture. There is now noted severely displaced and angulated fracture of the midshaft of the right femur. Status post right total knee arthroplasty. IMPRESSION: Acute severely  displaced and angulated midshaft fracture of the right femur. Electronically Signed   By: Lupita Raider M.D.   On: 10/03/2022 15:15   DG Shoulder Right  Result Date: 10/03/2022 CLINICAL DATA:  Right shoulder pain after fall today. EXAM: RIGHT SHOULDER - 2+ VIEW COMPARISON:  None Available. FINDINGS: There is no evidence of fracture or dislocation. Mild degenerative changes seen involving right acromioclavicular joint. Moderate degenerative joint disease of right glenohumeral joint is noted. Soft tissues are unremarkable. IMPRESSION: Degenerative changes as noted above.  No acute abnormality seen. Electronically Signed   By: Lupita Raider M.D.   On: 10/03/2022 15:12   DG Hand Complete Right  Result Date: 10/03/2022 CLINICAL DATA:  Right hand pain after fall today. EXAM: RIGHT HAND - COMPLETE 3+ VIEW COMPARISON:  None Available. FINDINGS: Moderately displaced and comminuted distal right radial fracture is noted. Severe degenerative changes seen involving the first carpometacarpal joint. Degenerative changes are seen involving multiple interphalangeal joints. IMPRESSION: Moderately displaced and comminuted distal right radial fracture. Minimally displaced ulnar styloid fracture. Electronically Signed   By: Lupita Raider M.D.   On: 10/03/2022 15:10   DG Wrist Complete Right  Result Date: 10/03/2022 CLINICAL DATA:  Right wrist pain after fall today. EXAM: RIGHT WRIST - COMPLETE 3+ VIEW COMPARISON:  None Available. FINDINGS: Moderately displaced and comminuted distal right radial fracture is noted with intra-articular extension. Minimally displaced ulnar styloid fracture is noted. IMPRESSION: Moderately displaced and comminuted distal right radial fracture with intra-articular extension. Minimally displaced ulnar styloid fracture. Electronically Signed   By: Lupita Raider M.D.   On: 10/03/2022 15:09    Subjective: Patient was seen and examined at bedside.  Overnight events noted.   Patient reports   doing much better and wants to be discharged.   Patient is being discharged to SNF.  Insurance auth. approved  Discharge Exam: Vitals:   10/10/22 0600 10/10/22 0827  BP: (!) 103/56 111/60  Pulse: 77 81  Resp: 20 17  Temp: 98.1 F (36.7 C) 98.3 F (36.8 C)  SpO2: 99% 98%   Vitals:   10/09/22 1524 10/09/22 1957 10/10/22 0600 10/10/22 0827  BP: (!) 113/44 113/75 (!) 103/56 111/60  Pulse: 78 76 77 81  Resp: 19 20 20 17   Temp: 98.6 F (37 C) 98.4 F (36.9 C) 98.1 F (36.7 C) 98.3 F (36.8 C)  TempSrc: Oral Oral Oral Oral  SpO2: 98% 97% 99% 98%  Weight:      Height:        General: Pt is alert, awake, not in acute distress Cardiovascular: RRR, S1/S2 +, no rubs, no gallops Respiratory: CTA bilaterally, no wheezing, no rhonchi Abdominal: Soft, NT, ND, bowel sounds + Extremities: RUE  in  sling, RLE s/p ORIF    The results of significant diagnostics from this hospitalization (including imaging, microbiology, ancillary and laboratory) are listed below for reference.     Microbiology: Recent Results (from the past 240 hour(s))  Urine Culture     Status: Abnormal   Collection Time: 10/03/22  4:39 PM   Specimen: Urine, Clean Catch  Result Value Ref Range Status   Specimen Description   Final    URINE, CLEAN CATCH Performed at [facility], [address], [place], [place] [address]    Special Requests   Final    NONE Performed at [facility], [address] [place], [place] [address]    Culture >=100,000 COLONIES/mL ESCHERICHIA COLI (A)  Final   Report Status 10/05/2022 FINAL  Final   Organism ID, Bacteria ESCHERICHIA COLI (A)  Final      Susceptibility   Escherichia coli - MIC*    AMPICILLIN >=32 RESISTANT Resistant     CEFAZOLIN <=4 SENSITIVE Sensitive     CEFEPIME <=0.12 SENSITIVE Sensitive     CEFTRIAXONE <=0.25 SENSITIVE Sensitive     CIPROFLOXACIN >=4 RESISTANT Resistant     GENTAMICIN <=1 SENSITIVE Sensitive     IMIPENEM <=0.25 SENSITIVE Sensitive      NITROFURANTOIN <=16 SENSITIVE Sensitive     TRIMETH/SULFA >=320 RESISTANT Resistant     AMPICILLIN/SULBACTAM 16 INTERMEDIATE Intermediate     PIP/TAZO <=4 SENSITIVE Sensitive     * >=100,000 COLONIES/mL ESCHERICHIA COLI  Surgical pcr screen     Status: Abnormal   Collection Time: 10/05/22  6:33 AM   Specimen: Nasal Mucosa; Nasal Swab  Result Value Ref Range Status   MRSA, PCR NEGATIVE NEGATIVE Final   Staphylococcus aureus POSITIVE (A) NEGATIVE Final    Comment: (NOTE) The Xpert SA Assay (FDA approved for NASAL specimens in patients 45 years of age and older), is one component of a comprehensive surveillance program. It is not intended to diagnose infection nor to guide or monitor treatment. Performed at [facility], [address], [place], [place] [address]      Labs: BNP (last 3 results) No results for input(s): "BNP" in the last 8760 hours. Basic Metabolic Panel: Recent Labs  Lab 10/04/22 0436 10/05/22 0338 10/06/22 0455 10/07/22 0859 10/08/22 0430 10/10/22 0547  NA 134* 133* 135 139 137 138  K 4.0 3.7 4.4 4.1 3.8 4.3  CL 103 106 109 109 110 104  CO2 19* 18* 20* 22 20* 23  GLUCOSE 97 138* 127* 90 79 96  BUN 44* 39* 27* 19 17 19   CREATININE 1.48* 1.18* 1.09* 0.89 0.89 1.04*  CALCIUM 8.2* 7.7* 8.1* 8.2* 8.2* 8.5*  MG 2.1  --  2.1 1.9 1.8 1.8  PHOS  --   --  3.0 2.1* 2.8 3.3   Liver Function Tests: Recent Labs  Lab 10/03/22 1354 10/04/22 0436  AST 74* 70*  ALT 25 30  ALKPHOS 83 69  BILITOT 0.7 1.0  PROT 6.9 6.0*  ALBUMIN 3.5 2.8*   No results for input(s): "LIPASE", "AMYLASE" in the last 168 hours. No results for input(s): "AMMONIA" in the last 168 hours. CBC: Recent Labs  Lab 10/04/22 0436 10/05/22 0338 10/06/22 0455 10/07/22 0859 10/08/22 0430 10/09/22 0445 10/10/22 0547  WBC 22.5* 15.4* 14.6* 14.6* 12.8* 13.0* 11.8*  NEUTROABS 18.5* 11.2*  --   --   --   --   --   HGB 8.1* 7.0* 7.6* 8.5* 8.8* 8.9* 8.6*  HCT 25.8* 22.9* 24.9*  27.7*  28.1* 28.6* 27.9*  MCV 95.9 94.6 93.6 93.0 92.7 94.4 93.0  PLT 168 154 167 190 197 229 238   Cardiac Enzymes: Recent Labs  Lab 10/03/22 1354 10/04/22 0436 10/05/22 0338 10/09/22 1109  CKTOTAL 1,991* 1,138* 489* 113   BNP: Invalid input(s): "POCBNP" CBG: Recent Labs  Lab 10/10/22 0823  GLUCAP 151*   D-Dimer No results for input(s): "DDIMER" in the last 72 hours. Hgb A1c No results for input(s): "HGBA1C" in the last 72 hours. Lipid Profile No results for input(s): "CHOL", "HDL", "LDLCALC", "TRIG", "CHOLHDL", "LDLDIRECT" in the last 72 hours. Thyroid function studies No results for input(s): "TSH", "T4TOTAL", "T3FREE", "THYROIDAB" in the last 72 hours.  Invalid input(s): "FREET3" Anemia work up No results for input(s): "VITAMINB12", "FOLATE", "FERRITIN", "TIBC", "IRON", "RETICCTPCT" in the last 72 hours. Urinalysis    Component Value Date/Time   COLORURINE YELLOW 10/03/2022 1639   APPEARANCEUR HAZY (A) 10/03/2022 1639   LABSPEC 1.017 10/03/2022 1639   PHURINE 5.0 10/03/2022 1639   GLUCOSEU NEGATIVE 10/03/2022 1639   HGBUR MODERATE (A) 10/03/2022 1639   BILIRUBINUR NEGATIVE 10/03/2022 1639   KETONESUR 5 (A) 10/03/2022 1639   PROTEINUR 100 (A) 10/03/2022 1639   NITRITE NEGATIVE 10/03/2022 1639   LEUKOCYTESUR MODERATE (A) 10/03/2022 1639   Sepsis Labs Recent Labs  Lab 10/07/22 0859 10/08/22 0430 10/09/22 0445 10/10/22 0547  WBC 14.6* 12.8* 13.0* 11.8*   Microbiology Recent Results (from the past 240 hour(s))  Urine Culture     Status: Abnormal   Collection Time: 10/03/22  4:39 PM   Specimen: Urine, Clean Catch  Result Value Ref Range Status   Specimen Description   Final    URINE, CLEAN CATCH Performed at [facility], [address], [place], [place] [address]    Special Requests   Final    NONE Performed at [facility], [address] [place], [place] [address]    Culture >=100,000 COLONIES/mL ESCHERICHIA COLI (A)  Final   Report Status 10/05/2022  FINAL  Final   Organism ID, Bacteria ESCHERICHIA COLI (A)  Final      Susceptibility   Escherichia coli - MIC*    AMPICILLIN >=32 RESISTANT Resistant     CEFAZOLIN <=4 SENSITIVE Sensitive     CEFEPIME <=0.12 SENSITIVE Sensitive     CEFTRIAXONE <=0.25 SENSITIVE Sensitive     CIPROFLOXACIN >=4 RESISTANT Resistant     GENTAMICIN <=1 SENSITIVE Sensitive     IMIPENEM <=0.25 SENSITIVE Sensitive     NITROFURANTOIN <=16 SENSITIVE Sensitive     TRIMETH/SULFA >=320 RESISTANT Resistant     AMPICILLIN/SULBACTAM 16 INTERMEDIATE Intermediate     PIP/TAZO <=4 SENSITIVE Sensitive     * >=100,000 COLONIES/mL ESCHERICHIA COLI  Surgical pcr screen     Status: Abnormal   Collection Time: 10/05/22  6:33 AM   Specimen: Nasal Mucosa; Nasal Swab  Result Value Ref Range Status   MRSA, PCR NEGATIVE NEGATIVE Final   Staphylococcus aureus POSITIVE (A) NEGATIVE Final    Comment: (NOTE) The Xpert SA Assay (FDA approved for NASAL specimens in patients 28 years of age and older), is one component of a comprehensive surveillance program. It is not intended to diagnose infection nor to guide or monitor treatment. Performed at [facility], [address], [place], [place] [address]      Time coordinating discharge: Over 30 minutes  SIGNED:   Willeen Niece, MD  [facility] 10/10/2022, 12:18 PM Pager   If 7PM-7AM, please contact night-coverage

## 2022-10-10 NOTE — Discharge Planning (Signed)
Patient alert. IV access removed. Patient trial of void completed, patient urinated. Discharge teaching given to [employer] at [facility] rehab. Discharge summary placed in discharge packet along with written prescriptions written by discharge provider. Patient will be transported to facility via [facility].

## 2022-10-10 NOTE — Progress Notes (Signed)
Physical Therapy Treatment Patient Details Name: Susan Osborne MRN: [id number] DOB: April 20, 1927 Today's Date: 10/10/2022   History of Present Illness The pt is a 89 yo female presenting 9/23 after a fall with R hip pain. Pt from home alone, but suspected down overnight until found by family morning of 9/23. Pt also recently dx with UTI but had not started antibiotics. Work up upon admission confirmed UTI, AKI, rhabdomyolysis, fx of R ulnar styloid, R radius, and mid-shaft of R femur. Pt now s/p ORIF of R femur and R distal radius 9/25. PMH includes: dementia, HTN, R hip surgery, R TKA, and stroke.    PT Comments  Pt with fair tolerance to treatment today.  Pt with similar presentation to previous session. Attempted multiple sit to stands however only able to come fully upright with +2 HHA. No change in DC/DME recs at this time. Pt anticipates DC to SNF later today.   If plan is discharge home, recommend the following: A lot of help with bathing/dressing/bathroom;Two people to help with walking and/or transfers;Direct supervision/assist for medications management;Assistance with cooking/housework;Help with stairs or ramp for entrance;Supervision due to cognitive status;Direct supervision/assist for financial management;Assist for transportation   Can travel by private vehicle     No  Equipment Recommendations  Other (comment) (Per accepting facility)    Recommendations for Other Services       Precautions / Restrictions Precautions Precautions: Fall Restrictions Weight Bearing Restrictions: Yes RUE Weight Bearing: Weight bear through elbow only RLE Weight Bearing: Weight bearing as tolerated     Mobility  Bed Mobility Overal bed mobility: Needs Assistance Bed Mobility: Supine to Sit, Sit to Supine     Supine to sit: Mod assist, HOB elevated Sit to supine: Mod assist, +2 for physical assistance, Used rails   General bed mobility comments: modA for bed mobility, needing assist to RLE  and to adjust in bed +2 to boost back into position    Transfers Overall transfer level: Needs assistance Equipment used: Right platform walker, 2 person hand held assist Transfers: Sit to/from Stand Sit to Stand: +2 physical assistance, Max assist, From elevated surface           General transfer comment: Attempted 2 sit to stands with platform RW however pt unable to come fully upright. Opted for +2 HHA and pt able to come upright for a few seconds.    Ambulation/Gait               General Gait Details: Pt unable to side step   Stairs             Wheelchair Mobility     Tilt Bed    Modified Rankin (Stroke Patients Only)       Balance Overall balance assessment: Needs assistance Sitting-balance support: Single extremity supported, Feet supported Sitting balance-Leahy Scale: Fair Sitting balance - Comments: sitting EOB, assist to scoot to edge, able to tolerate min challenge   Standing balance support: During functional activity, Reliant on assistive device for balance Standing balance-Leahy Scale: Poor Standing balance comment: standing with HHA mod A, limited wt on RLE due to pain                            Cognition Arousal: Alert Behavior During Therapy: WFL for tasks assessed/performed Overall Cognitive Status: History of cognitive impairments - at baseline  General Comments: pt able to follow simple commands, needing cues for posture and position. continued to state that she "just needs a little time" to recover        Exercises      General Comments General comments (skin integrity, edema, etc.): VSS      Pertinent Vitals/Pain Pain Assessment Pain Assessment: Faces Faces Pain Scale: Hurts even more Pain Location: R leg, R wrist Pain Descriptors / Indicators: Aching, Constant, Grimacing Pain Intervention(s): Monitored during session, Premedicated before session    Home  Living                          Prior Function            PT Goals (current goals can now be found in the care plan section) Progress towards PT goals: Progressing toward goals    Frequency    Min 1X/week      PT Plan      Co-evaluation              AM-PAC PT "6 Clicks" Mobility   Outcome Measure  Help needed turning from your back to your side while in a flat bed without using bedrails?: A Little Help needed moving from lying on your back to sitting on the side of a flat bed without using bedrails?: A Lot Help needed moving to and from a bed to a chair (including a wheelchair)?: A Lot Help needed standing up from a chair using your arms (e.g., wheelchair or bedside chair)?: A Lot Help needed to walk in hospital room?: Total Help needed climbing 3-5 steps with a railing? : Total 6 Click Score: 11    End of Session Equipment Utilized During Treatment: Gait belt Activity Tolerance: Patient tolerated treatment well Patient left: in bed;with call bell/phone within reach;with bed alarm set;with family/visitor present Nurse Communication: Mobility status PT Visit Diagnosis: Unsteadiness on feet (R26.81);Other abnormalities of gait and mobility (R26.89);Muscle weakness (generalized) (M62.81);Pain Pain - Right/Left: Right Pain - part of body: Leg     Time: 1020-1040 PT Time Calculation (min) (ACUTE ONLY): 20 min  Charges:    $Therapeutic Activity: 8-22 mins PT General Charges $$ ACUTE PT VISIT: 1 Visit                     Shela Nevin, PT, DPT Acute Rehab Services [phone]    Gladys Damme 10/10/2022, 10:52 AM

## 2022-10-10 NOTE — TOC Transition Note (Signed)
Transition of Care [facility]) - CM/SW Discharge Note   Patient Details  Name: Susan Osborne MRN: [id number] Date of Birth: 1927/07/05  Transition of Care [facility]) CM/SW Contact:  Lorri Frederick, LCSW Phone Number: 10/10/2022, 2:54 PM   Clinical Narrative:   Pt discharging to [facility].  RN call report to [phone].     Final next level of care: Skilled Nursing Facility Barriers to Discharge: Barriers Resolved   Patient Goals and CMS Choice   Choice offered to / list presented to : Adult Children, Patient (daughter [name])  Discharge Placement                Patient chooses bed at:  [facility]) Patient to be transferred to facility by: [facility] Name of family member notified: daughter [name] in room Patient and family notified of of transfer: 10/10/22  Discharge Plan and Services Additional resources added to the After Visit Summary for   In-house Referral: Clinical Social Work   Post Acute Care Choice: Skilled Nursing Facility                               Social Determinants of Health (SDOH) Interventions SDOH Screenings   Food Insecurity: No Food Insecurity (10/04/2022)  Housing: Medium Risk (10/04/2022)  Transportation Needs: No Transportation Needs (10/04/2022)  Tobacco Use: Medium Risk (10/05/2022)     Readmission Risk Interventions     No data to display

## 2022-10-10 NOTE — TOC Progression Note (Signed)
Transition of Care [facility]) - Progression Note    Patient Details  Name: Susan Osborne MRN: [id number] Date of Birth: 06/10/1927  [facility]) CM/SW Contact  Lorri Frederick, LCSW Phone Number: 10/10/2022, 10:18 AM  Clinical Narrative:   [facility]. [facility] has been approved.  They can receive pt today.  MD notified.    Expected Discharge Plan: Skilled Nursing Facility Barriers to Discharge: Continued Medical Work up, SNF Pending bed offer  Expected Discharge Plan and Services In-house Referral: Clinical Social Work   Post Acute Care Choice: Skilled Nursing Facility Living arrangements for the past 2 months: Single Family Home                                       Social Determinants of Health (SDOH) Interventions SDOH Screenings   Food Insecurity: No Food Insecurity (10/04/2022)  Housing: Medium Risk (10/04/2022)  Transportation Needs: No Transportation Needs (10/04/2022)  Tobacco Use: Medium Risk (10/05/2022)    Readmission Risk Interventions     No data to display

## 2022-10-10 NOTE — Discharge Instructions (Signed)
Advised to follow-up with orthopedics Dr. Lorra Hals in 2 weeks. Advised to follow up instructions as recommended by orthopedics. Continue Eliquis 5 mg twice daily for DVT prophylaxis

## 2023-02-22 IMAGING — CT CT HEAD W/O CM
3 of 4 series · 16 of 47 positions shown, 19 images · non-contrast
Comparison: April 21, 2020

CLINICAL DATA: Headache and vomiting.

EXAM:
CT HEAD WITHOUT CONTRAST
TECHNIQUE: Contiguous axial images were obtained from the base of the skull
through the vertex without intravenous contrast.

[Series 2: head w o · axial · 0.41mm/px · z∈[+35,+155]mm · 10 of 30 slices shown, 13 images]
[im 3/30  brain]
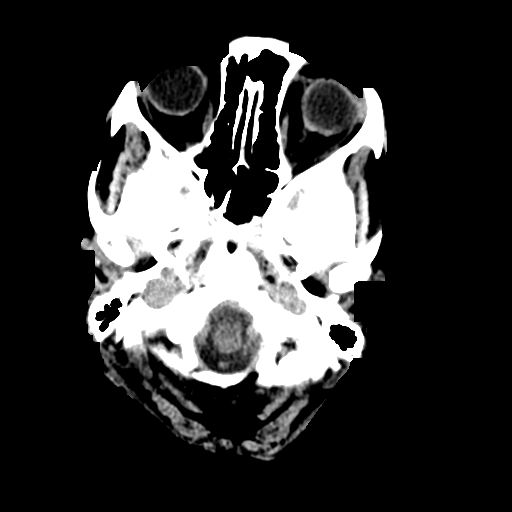
[im 3/30  bone]
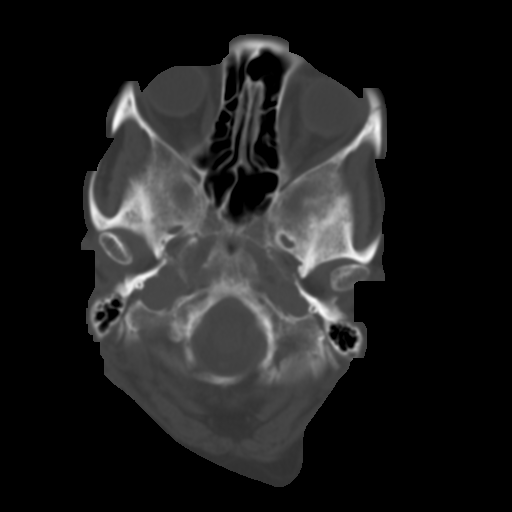
[im 5/30  brain]
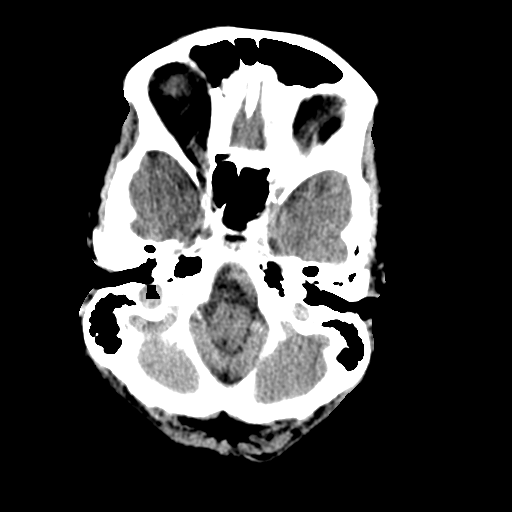
[im 9/30  brain]
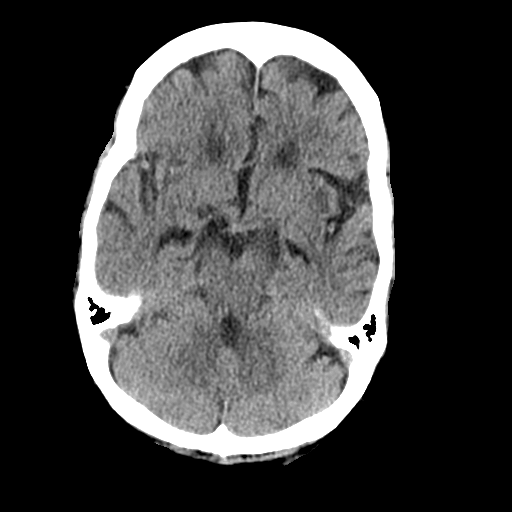
[im 11/30  brain]
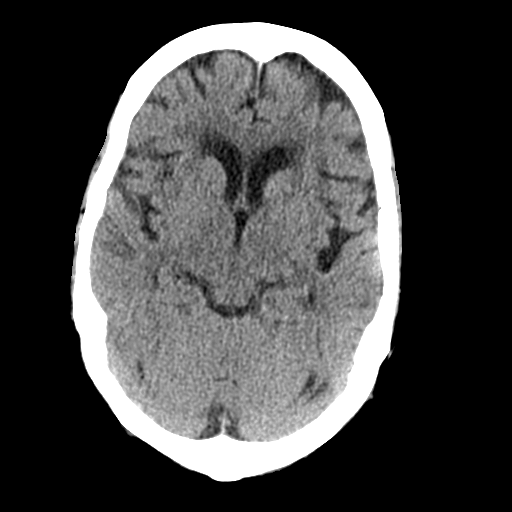
[im 13/30  brain]
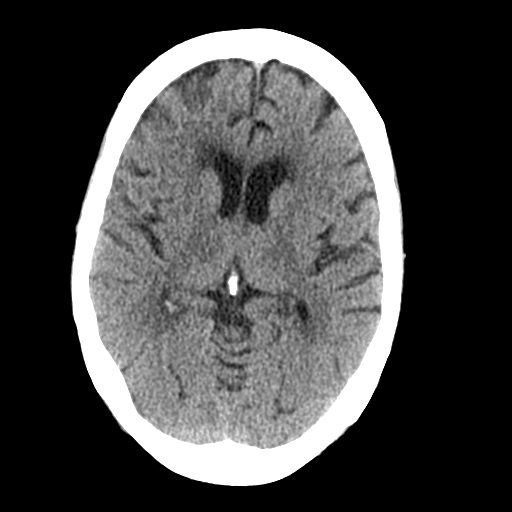
[im 13/30  bone]
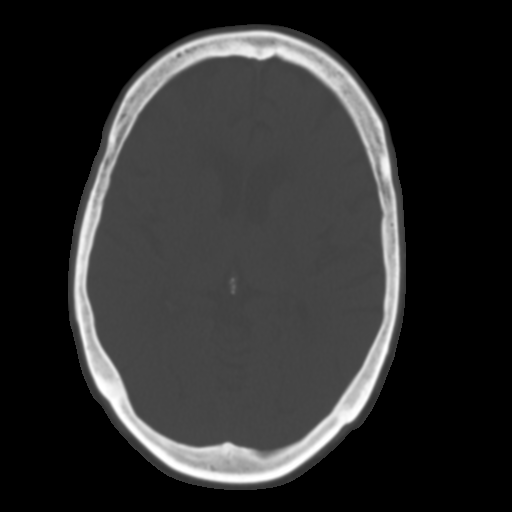
[im 17/30  brain]
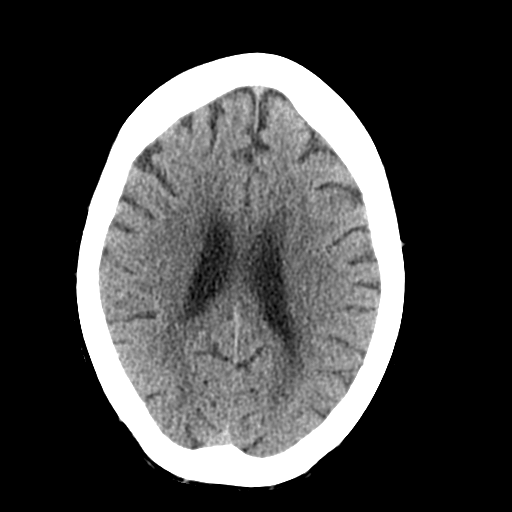
[im 19/30  brain]
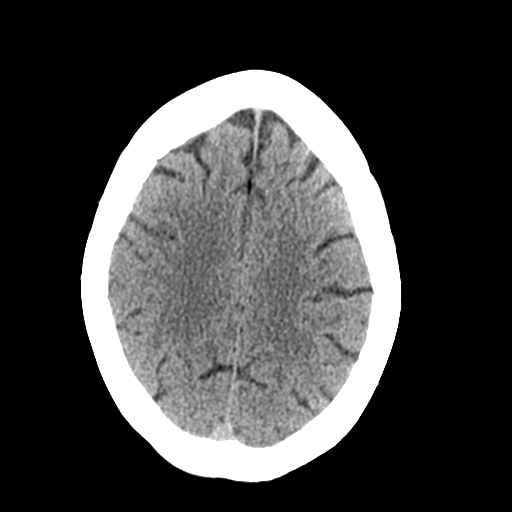
[im 21/30  brain]
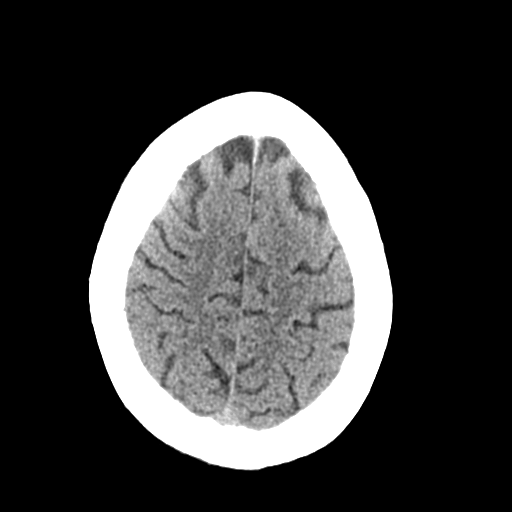
[im 25/30  brain]
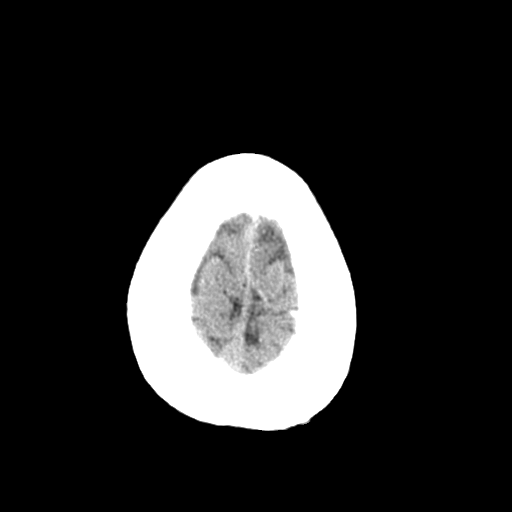
[im 25/30  bone]
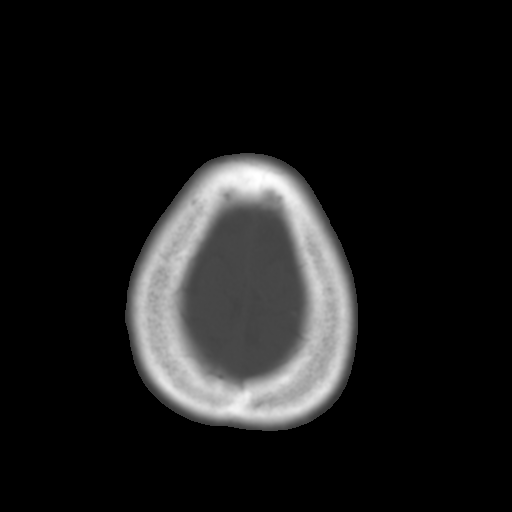
[im 27/30  brain]
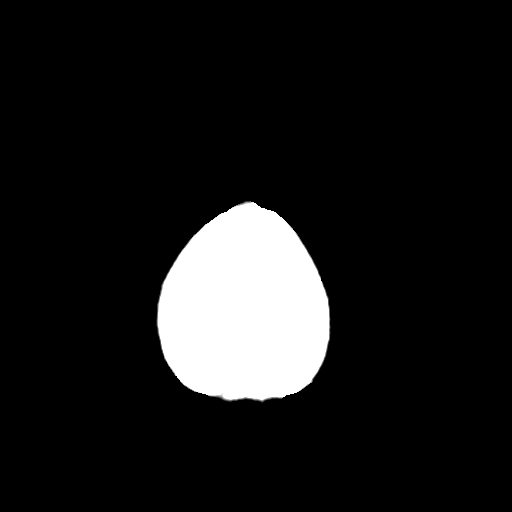

[Series 4: coronal soft · coronal · 0.31mm/px · 3 of 77 slices shown]
[im 26/77  brain]
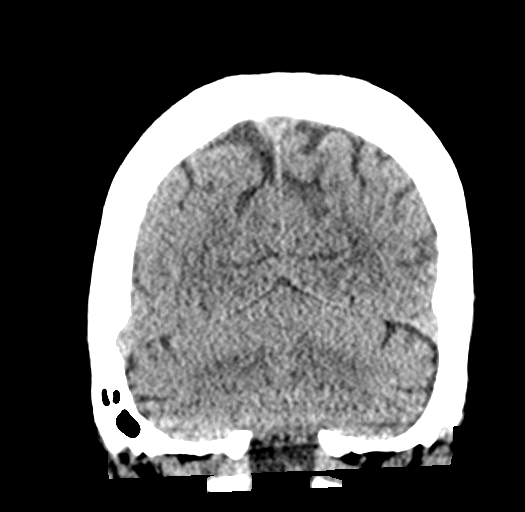
[im 34/77  brain]
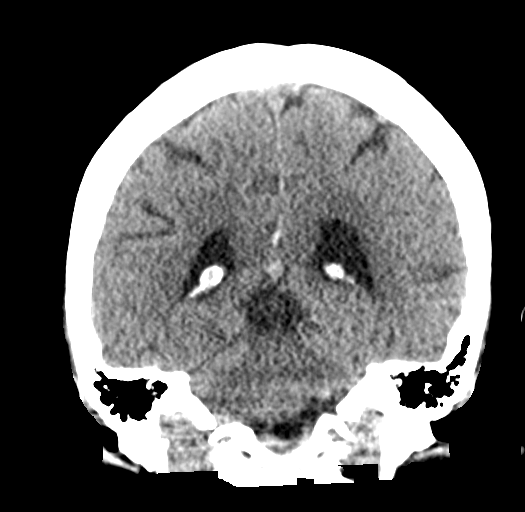
[im 43/77  brain]
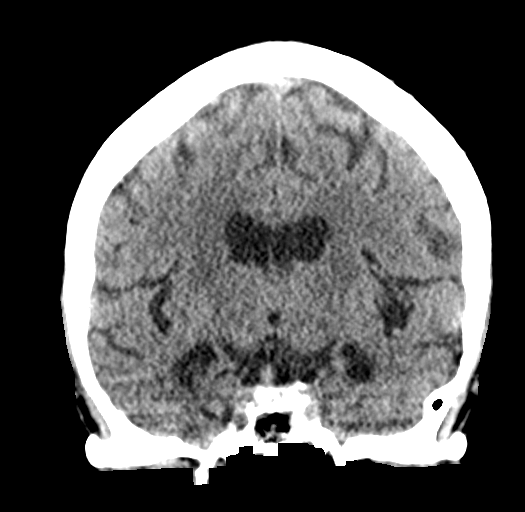

[Series 5: sagittal soft · sagittal · 0.33mm/px · 3 of 58 slices shown]
[im 20/58  brain]
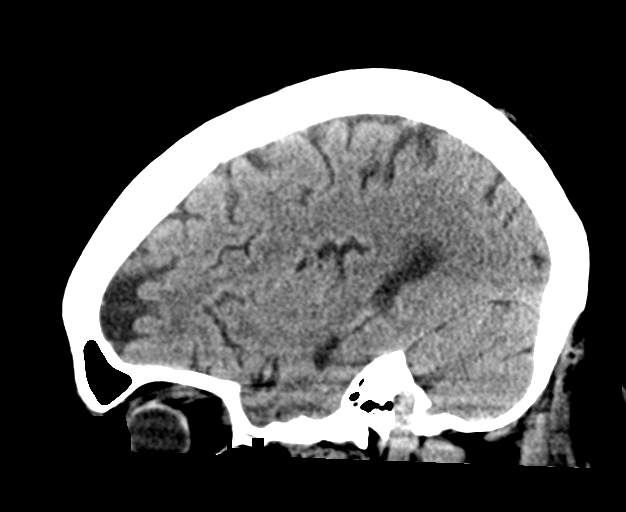
[im 29/58  brain]
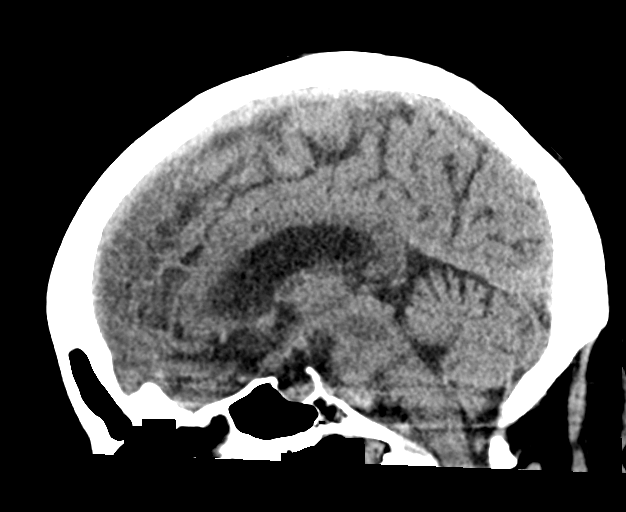
[im 39/58  brain]
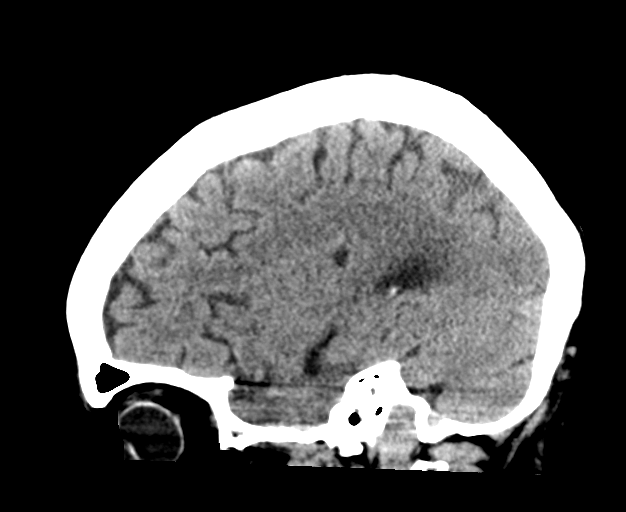

[16 of 47 positions shown; findings below may reference images not displayed]

FINDINGS: Brain: There is mild cerebral atrophy with widening of the
extra-axial spaces and ventricular dilatation.
There are areas of decreased attenuation within the white matter
tracts of the supratentorial brain, consistent with microvascular
disease changes.

Vascular: No hyperdense vessel or unexpected calcification.

Skull: Normal. Negative for fracture or focal lesion.

Sinuses/Orbits: No acute finding.

Other: None.
IMPRESSION: 1. Generalized cerebral atrophy.
2. No acute intracranial abnormality.

## 2023-12-08 ENCOUNTER — Emergency Department (HOSPITAL_COMMUNITY)

## 2023-12-08 ENCOUNTER — Other Ambulatory Visit: Payer: Self-pay

## 2023-12-08 ENCOUNTER — Encounter (HOSPITAL_COMMUNITY): Payer: Self-pay | Admitting: *Deleted

## 2023-12-08 ENCOUNTER — Emergency Department (HOSPITAL_COMMUNITY)
Admission: EM | Admit: 2023-12-08 | Discharge: 2023-12-08 | Disposition: A | Discharge status: Home / Self Care | Attending: Emergency Medicine | Admitting: Emergency Medicine

## 2023-12-08 DIAGNOSIS — F0390 Unspecified dementia without behavioral disturbance: Secondary | ICD-10-CM | POA: Insufficient documentation

## 2023-12-08 DIAGNOSIS — I509 Heart failure, unspecified: Secondary | ICD-10-CM | POA: Diagnosis not present

## 2023-12-08 DIAGNOSIS — J209 Acute bronchitis, unspecified: Secondary | ICD-10-CM | POA: Diagnosis not present

## 2023-12-08 DIAGNOSIS — Z7901 Long term (current) use of anticoagulants: Secondary | ICD-10-CM | POA: Diagnosis not present

## 2023-12-08 DIAGNOSIS — J45909 Unspecified asthma, uncomplicated: Secondary | ICD-10-CM | POA: Diagnosis not present

## 2023-12-08 DIAGNOSIS — R059 Cough, unspecified: Secondary | ICD-10-CM | POA: Diagnosis present

## 2023-12-08 DIAGNOSIS — J210 Acute bronchiolitis due to respiratory syncytial virus: Secondary | ICD-10-CM | POA: Diagnosis not present

## 2023-12-08 DIAGNOSIS — J208 Acute bronchitis due to other specified organisms: Secondary | ICD-10-CM

## 2023-12-08 MED ORDER — BENZONATATE 100 MG PO CAPS
200.0000 mg | ORAL_CAPSULE | Freq: Once | ORAL | Status: AC
  Administered 2023-12-08: 200 mg via ORAL
  Filled 2023-12-08: qty 2

## 2023-12-08 MED ORDER — BENZONATATE 100 MG PO CAPS: 100.0000 mg | ORAL_CAPSULE | Freq: Three times a day (TID) | ORAL | 0 refills | Status: DC

## 2023-12-08 MED ORDER — BENZONATATE 100 MG PO CAPS: 100.0000 mg | ORAL_CAPSULE | Freq: Three times a day (TID) | ORAL | 0 refills | Status: AC

## 2023-12-08 MED ORDER — IPRATROPIUM-ALBUTEROL 0.5-2.5 (3) MG/3ML IN SOLN
3.0000 mL | Freq: Once | RESPIRATORY_TRACT | Status: AC
  Administered 2023-12-08: 3 mL via RESPIRATORY_TRACT
  Filled 2023-12-08: qty 3

## 2023-12-08 NOTE — Discharge Instructions (Signed)
 Your x-ray is normal, no signs of pneumonia This will likely get better over the next week but it will not get better very quickly  Albuterol is an inhaled medication which can help you to breathe better, you should take 2 puffs every 4 hours as needed, this may cause your heart to feel like it is racing, this should be a temporary side effect.  Tessalon is a cough medication that helps reduce the amount of coughing that you are having.  You may take up to 200 mg every 8 hours as needed.  This can be used safely with most or other over-the-counter medications but talk to your pharmacist before taking anything else over-the-counter with it  Thank you for allowing us  to treat you in the emergency department today.  After reviewing your examination and potential testing that was done it appears that you are safe to go home.  I would like for you to follow-up with your doctor within the next several days, have them obtain your records and follow-up with them to review all potential tests and results from your visit.  If you should develop severe or worsening symptoms return to the emergency department immediately

## 2023-12-08 NOTE — ED Triage Notes (Addendum)
 Pt with dementia, daughter provided information, pt tested positive for RSV the other day. Denies any fever. Daughter concerned for PNA

## 2023-12-08 NOTE — ED Provider Notes (Signed)
 [place] EMERGENCY DEPARTMENT AT [facility] Provider Note   CSN: [id number] Arrival date & time: 12/08/23  1500     Patient presents with: Cough (RSV +)   Susan Osborne is a 89 y.o. female.    Cough  This patient is a 89 year old female, she has a history of asthma in the past, she takes albuterol inhalers occasionally, she also has a history of congestive heart failure, on Eliquis , does not smoke cigarettes anymore though she smoked when she was younger, also has dementia.  Patient presents with increasing coughing and shortness of breath over the week, initially was seen at an outside hospital and diagnosed with RSV with a positive swab, given the steroid pack which has not really helped her to cough.  The daughter brings her here although they live in [place] for second evaluation because of the ongoing cough and concern of pneumonia.  There is been no fevers no vomiting no diarrhea and the sore throat has resolved, there is a bit of a runny nose with it, she denies any sick contacts.    Prior to Admission medications   Medication Sig Start Date End Date Taking? Authorizing Provider  benzonatate (TESSALON) 100 MG capsule Take 1 capsule (100 mg total) by mouth every 8 (eight) hours. 12/08/23  Yes Cleotilde Rogue, MD  albuterol (PROVENTIL) (2.5 MG/3ML) 0.083% nebulizer solution Take 2.5 mg by nebulization every 8 (eight) hours as needed for wheezing or shortness of breath. 06/01/22   [provider]  albuterol (VENTOLIN HFA) 108 (90 Base) MCG/ACT inhaler Inhale 2 puffs into the lungs every 4 (four) hours as needed for wheezing or shortness of breath. 06/01/22   [provider]  amLODipine  (NORVASC ) 5 MG tablet Take 5 mg by mouth daily. 07/02/19   [provider]  apixaban  (ELIQUIS ) 2.5 MG TABS tablet Take 1 tablet (2.5 mg total) by mouth 2 (two) times daily. 10/07/22 11/06/22  Danton Lauraine LABOR, PA-C  atorvastatin  (LIPITOR) 40 MG tablet Take 40 mg by  mouth at bedtime. 04/03/19   [provider]  busPIRone  (BUSPAR ) 10 MG tablet Take 20 mg by mouth 2 (two) times daily. 04/08/20   [provider]  Calcium  Carb-Cholecalciferol (CALCIUM  500 + D) 500-125 MG-UNIT TABS Take 1 tablet by mouth daily.    [provider]  dipyridamole -aspirin  (AGGRENOX ) 200-25 MG 12hr capsule Take 1 capsule by mouth 2 (two) times daily after a meal. 05/26/22   [provider]  docusate sodium  (COLACE) 100 MG capsule Take 1 capsule (100 mg total) by mouth 2 (two) times daily. 10/10/22   Leotis Bogus, MD  donepezil  (ARICEPT ) 10 MG tablet Take 10 mg by mouth at bedtime. 09/30/19   [provider]  famotidine  (PEPCID ) 40 MG tablet Take 40 mg by mouth daily. 03/25/20   [provider]  levETIRAcetam  (KEPPRA ) 250 MG tablet Take 250 mg by mouth 2 (two) times daily. 06/28/22   [provider]  levothyroxine  (SYNTHROID ) 25 MCG tablet Take 25 mcg by mouth daily before breakfast. 03/30/20   [provider]  nitrofurantoin, macrocrystal-monohydrate, (MACROBID) 100 MG capsule Take 100 mg by mouth 2 (two) times daily. 10/03/22   [provider]  omeprazole (PRILOSEC) 40 MG capsule Take 40 mg by mouth every morning. 03/02/22   [provider]  oxyCODONE  (OXY IR/ROXICODONE ) 5 MG immediate release tablet Take 1 tablet (5 mg total) by mouth every 4 (four) hours as needed for moderate pain. 10/07/22   Danton Lauraine LABOR, PA-C  QUEtiapine (SEROQUEL) 25 MG tablet Take 25 mg by mouth at bedtime. 08/01/22   [provider]  venlafaxine  XR (EFFEXOR -XR) 150 MG 24 hr capsule Take 150 mg by mouth in the morning and at bedtime. 04/08/20   [provider]  Vitamin D , Ergocalciferol , (DRISDOL ) 1.25 MG (50000 UNIT) CAPS capsule Take 1 capsule (50,000 Units total) by mouth every 7 (seven) days. 10/12/22   Danton Lauraine LABOR, PA-C  citalopram (CELEXA) 10 MG tablet citalopram 10 mg tablet Patient not taking: No sig  reported  07/24/20  [provider]  DULoxetine (CYMBALTA) 30 MG capsule Take 30 mg by mouth at bedtime. Patient not taking: No sig reported 12/12/19 07/24/20  [provider]  enoxaparin  (LOVENOX ) 30 MG/0.3ML injection SMARTSIG:0.3 Milliliter(s) SUB-Q Daily Patient not taking: No sig reported 10/29/19 07/24/20  [provider]  furosemide (LASIX) 20 MG tablet furosemide 20 mg tablet  TK 1 T PO QAM Patient not taking: No sig reported  07/24/20  [provider]  potassium chloride (KLOR-CON) 10 MEQ tablet Take 10 mEq by mouth daily. Patient not taking: No sig reported 12/30/19 07/24/20  [provider]    Allergies: Codeine    Review of Systems  Respiratory:  Positive for cough.   All other systems reviewed and are negative.   Updated Vital Signs BP (!) 101/52   Pulse 61   Temp 97.9 F (36.6 C) (Oral)   Resp 18   Ht 1.575 m (5' 2)   Wt 81.6 kg   SpO2 98%   BMI 32.90 kg/m   Physical Exam Vitals and nursing note reviewed.  Constitutional:      General: She is not in acute distress.    Appearance: She is well-developed.  HENT:     Head: Normocephalic and atraumatic.     Mouth/Throat:     Pharynx: No oropharyngeal exudate.  Eyes:     General: No scleral icterus.       Right eye: No discharge.        Left eye: No discharge.     Conjunctiva/sclera: Conjunctivae normal.     Pupils: Pupils are equal, round, and reactive to light.  Neck:     Thyroid: No thyromegaly.     Vascular: No JVD.  Cardiovascular:     Rate and Rhythm: Normal rate and regular rhythm.     Heart sounds: Normal heart sounds. No murmur heard.    No friction rub. No gallop.  Pulmonary:     Effort: Pulmonary effort is normal. No respiratory distress.     Breath sounds: Wheezing present. No rales.  Abdominal:     General: Bowel sounds are normal. There is no distension.     Palpations: Abdomen is soft. There is no mass.     Tenderness: There is no abdominal  tenderness.  Musculoskeletal:        General: No tenderness. Normal range of motion.     Cervical back: Normal range of motion and neck supple.     Right lower leg: No edema.     Left lower leg: No edema.  Lymphadenopathy:     Cervical: No cervical adenopathy.  Skin:    General: Skin is warm and dry.     Findings: No erythema or rash.  Neurological:     Mental Status: She is alert.     Coordination: Coordination normal.  Psychiatric:        Behavior: Behavior normal.     (all labs ordered are listed,  but only abnormal results are displayed) Labs Reviewed - No data to display  EKG: None  Radiology: DG Chest 2 View Result Date: 12/08/2023 EXAM: 2 VIEW(S) XRAY OF THE CHEST 12/08/2023 03:43:00 PM COMPARISON: 10/03/2022 CLINICAL HISTORY: cough, sob FINDINGS: LUNGS AND PLEURA: No focal pulmonary opacity. No pleural effusion. No pneumothorax. HEART AND MEDIASTINUM: Cardiomegaly. Tortuous aorta with calcified atherosclerosis. BONES AND SOFT TISSUES: Chronic degenerative change of right shoulder. Osteopenia. Similar degenerative changes in the spine. IMPRESSION: 1. No acute cardiopulmonary findings. Electronically signed by: Donnice Mania MD 12/08/2023 04:13 PM EST RP Workstation: [id number]     Procedures   Medications Ordered in the ED  benzonatate (TESSALON) capsule 200 mg (200 mg Oral Given 12/08/23 1540)  ipratropium-albuterol (DUONEB) 0.5-2.5 (3) MG/3ML nebulizer solution 3 mL (3 mLs Nebulization Given 12/08/23 1540)                                    Medical Decision Making Amount and/or Complexity of Data Reviewed Radiology: ordered.  Risk Prescription drug management.   This elderly female is somewhat frail, lives with her daughter, has almost pristine vital signs with a heart rate of 70 and oxygen of 99% on room air no edema and expiratory wheezing consistent with reactive airway disease at baseline and possibly some coexistent RSV.  Will rule out pneumonia, the  patient is able to speak in full sentences.  Patient and daughter are agreeable to the plan   Radiology Imaging: I personally viewed the images of the ordered radiographic studies and find no acute findings on x-ray to suggest pneumonia I agree with the radiologist interpretation as well  Meds / Interventions: while in the ED the patient received the following: DuoNeb and Tessalon The response to the interventions was that the patient lightly improved   I have discussed with the patient at the bedside the results, and the meaning of these results.  They have had opportunity to ask questions,  expressed their understanding to the need for follow-up with primary care physician       Final diagnoses:  Viral bronchitis  RSV (acute bronchiolitis due to respiratory syncytial virus)    ED Discharge Orders          Ordered    benzonatate (TESSALON) 100 MG capsule  Every 8 hours        12/08/23 1839               Cleotilde Rogue, MD 12/08/23 1840
